# Patient Record
Sex: Female | Born: 1975
Health system: Southern US, Community
[De-identification: ages and names within clinical notes are randomized; demographics above are authoritative.]

## PROBLEM LIST (undated history)

## (undated) DIAGNOSIS — J45909 Unspecified asthma, uncomplicated: Secondary | ICD-10-CM

## (undated) DIAGNOSIS — T7840XA Allergy, unspecified, initial encounter: Secondary | ICD-10-CM

## (undated) HISTORY — DX: Unspecified asthma, uncomplicated: J45.909

## (undated) HISTORY — DX: Allergy, unspecified, initial encounter: T78.40XA

---

## 1997-12-10 ENCOUNTER — Other Ambulatory Visit: Admission: RE | Admit: 1997-12-10 | Discharge: 1997-12-10 | Payer: Self-pay | Admitting: Family Medicine

## 2000-07-25 ENCOUNTER — Other Ambulatory Visit: Admission: RE | Admit: 2000-07-25 | Discharge: 2000-07-25 | Payer: Self-pay | Admitting: Family Medicine

## 2000-08-14 ENCOUNTER — Ambulatory Visit (HOSPITAL_COMMUNITY): Admission: RE | Admit: 2000-08-14 | Discharge: 2000-08-14 | Payer: Self-pay | Admitting: Family Medicine

## 2000-08-14 ENCOUNTER — Encounter: Payer: Self-pay | Admitting: Family Medicine

## 2000-09-08 ENCOUNTER — Ambulatory Visit (HOSPITAL_COMMUNITY): Admission: RE | Admit: 2000-09-08 | Discharge: 2000-09-08 | Payer: Self-pay | Admitting: Family Medicine

## 2000-09-08 ENCOUNTER — Encounter: Payer: Self-pay | Admitting: Family Medicine

## 2000-11-26 ENCOUNTER — Encounter: Payer: Self-pay | Admitting: Family Medicine

## 2000-11-26 ENCOUNTER — Inpatient Hospital Stay (HOSPITAL_COMMUNITY): Admission: AD | Admit: 2000-11-26 | Discharge: 2000-11-29 | Payer: Self-pay | Admitting: Family Medicine

## 2001-01-27 ENCOUNTER — Inpatient Hospital Stay (HOSPITAL_COMMUNITY): Admission: AD | Admit: 2001-01-27 | Discharge: 2001-01-27 | Payer: Self-pay | Admitting: Family Medicine

## 2001-01-29 ENCOUNTER — Encounter: Payer: Self-pay | Admitting: Family Medicine

## 2001-01-29 ENCOUNTER — Inpatient Hospital Stay (HOSPITAL_COMMUNITY): Admission: AD | Admit: 2001-01-29 | Discharge: 2001-01-29 | Payer: Self-pay | Admitting: Family Medicine

## 2001-01-31 ENCOUNTER — Inpatient Hospital Stay (HOSPITAL_COMMUNITY): Admission: AD | Admit: 2001-01-31 | Discharge: 2001-02-03 | Payer: Self-pay | Admitting: Family Medicine

## 2002-07-02 ENCOUNTER — Other Ambulatory Visit: Admission: RE | Admit: 2002-07-02 | Discharge: 2002-07-02 | Payer: Self-pay | Admitting: Family Medicine

## 2002-07-05 ENCOUNTER — Ambulatory Visit (HOSPITAL_COMMUNITY): Admission: RE | Admit: 2002-07-05 | Discharge: 2002-07-05 | Payer: Self-pay | Admitting: Family Medicine

## 2002-07-05 ENCOUNTER — Encounter: Payer: Self-pay | Admitting: Family Medicine

## 2002-08-14 ENCOUNTER — Ambulatory Visit (HOSPITAL_COMMUNITY): Admission: RE | Admit: 2002-08-14 | Discharge: 2002-08-14 | Payer: Self-pay | Admitting: Family Medicine

## 2002-08-14 ENCOUNTER — Encounter: Payer: Self-pay | Admitting: Family Medicine

## 2002-09-04 ENCOUNTER — Ambulatory Visit (HOSPITAL_COMMUNITY): Admission: RE | Admit: 2002-09-04 | Discharge: 2002-09-04 | Payer: Self-pay | Admitting: Family Medicine

## 2002-09-04 ENCOUNTER — Encounter: Payer: Self-pay | Admitting: Family Medicine

## 2002-12-26 ENCOUNTER — Inpatient Hospital Stay (HOSPITAL_COMMUNITY): Admission: AD | Admit: 2002-12-26 | Discharge: 2002-12-27 | Payer: Self-pay | Admitting: Family Medicine

## 2002-12-29 ENCOUNTER — Inpatient Hospital Stay (HOSPITAL_COMMUNITY): Admission: AD | Admit: 2002-12-29 | Discharge: 2002-12-29 | Payer: Self-pay | Admitting: Family Medicine

## 2003-01-03 ENCOUNTER — Inpatient Hospital Stay (HOSPITAL_COMMUNITY): Admission: AD | Admit: 2003-01-03 | Discharge: 2003-01-04 | Payer: Self-pay | Admitting: Family Medicine

## 2012-06-02 ENCOUNTER — Other Ambulatory Visit: Payer: Self-pay | Admitting: Nurse Practitioner

## 2012-06-02 MED ORDER — IBUPROFEN 200 MG PO TABS: 200.0000 mg | ORAL_TABLET | Freq: Four times a day (QID) | ORAL | Status: DC | PRN

## 2012-07-05 ENCOUNTER — Other Ambulatory Visit: Payer: Self-pay | Admitting: Nurse Practitioner

## 2012-07-16 ENCOUNTER — Encounter: Payer: Self-pay | Admitting: General Practice

## 2012-07-16 ENCOUNTER — Ambulatory Visit (INDEPENDENT_AMBULATORY_CARE_PROVIDER_SITE_OTHER): Payer: BC Managed Care – PPO | Admitting: General Practice

## 2012-07-16 VITALS — BP 113/79 | HR 75 | Temp 97.3°F | Ht 64.5 in | Wt 171.0 lb

## 2012-07-16 DIAGNOSIS — N390 Urinary tract infection, site not specified: Secondary | ICD-10-CM

## 2012-07-16 DIAGNOSIS — L709 Acne, unspecified: Secondary | ICD-10-CM

## 2012-07-16 DIAGNOSIS — N912 Amenorrhea, unspecified: Secondary | ICD-10-CM

## 2012-07-16 DIAGNOSIS — L708 Other acne: Secondary | ICD-10-CM

## 2012-07-16 LAB — POCT UA - MICROSCOPIC ONLY
Casts, Ur, LPF, POC: NEGATIVE
Crystals, Ur, HPF, POC: NEGATIVE
Mucus, UA: NEGATIVE
Yeast, UA: NEGATIVE

## 2012-07-16 LAB — POCT URINALYSIS DIPSTICK
Glucose, UA: NEGATIVE
Nitrite, UA: POSITIVE
Spec Grav, UA: 1.02
Urobilinogen, UA: NEGATIVE
pH, UA: 6

## 2012-07-16 LAB — HCG, QUANTITATIVE, PREGNANCY: hCG, Beta Chain, Quant, S: <2 m[IU]/mL

## 2012-07-16 MED ORDER — NITROFURANTOIN MONOHYD MACRO 100 MG PO CAPS: 100.0000 mg | ORAL_CAPSULE | Freq: Two times a day (BID) | ORAL | Status: DC

## 2012-07-16 NOTE — Patient Instructions (Addendum)
Urinary Tract Infection  Urinary tract infections (UTIs) can develop anywhere along your urinary tract. Your urinary tract is your body's drainage system for removing wastes and extra water. Your urinary tract includes two kidneys, two ureters, a bladder, and a urethra. Your kidneys are a pair of bean-shaped organs. Each kidney is about the size of your fist. They are located below your ribs, one on each side of your spine.  CAUSES  Infections are caused by microbes, which are microscopic organisms, including fungi, viruses, and bacteria. These organisms are so small that they can only be seen through a microscope. Bacteria are the microbes that most commonly cause UTIs.  SYMPTOMS   Symptoms of UTIs may vary by age and gender of the patient and by the location of the infection. Symptoms in young women typically include a frequent and intense urge to urinate and a painful, burning feeling in the bladder or urethra during urination. Older women and men are more likely to be tired, shaky, and weak and have muscle aches and abdominal pain. A fever may mean the infection is in your kidneys. Other symptoms of a kidney infection include pain in your back or sides below the ribs, nausea, and vomiting.  DIAGNOSIS  To diagnose a UTI, your caregiver will ask you about your symptoms. Your caregiver also will ask to provide a urine sample. The urine sample will be tested for bacteria and white blood cells. White blood cells are made by your body to help fight infection.  TREATMENT   Typically, UTIs can be treated with medication. Because most UTIs are caused by a bacterial infection, they usually can be treated with the use of antibiotics. The choice of antibiotic and length of treatment depend on your symptoms and the type of bacteria causing your infection.  HOME CARE INSTRUCTIONS   If you were prescribed antibiotics, take them exactly as your caregiver instructs you. Finish the medication even if you feel better after you  have only taken some of the medication.   Drink enough water and fluids to keep your urine clear or pale yellow.   Avoid caffeine, tea, and carbonated beverages. They tend to irritate your bladder.   Empty your bladder often. Avoid holding urine for long periods of time.   Empty your bladder before and after sexual intercourse.   After a bowel movement, women should cleanse from front to back. Use each tissue only once.  SEEK MEDICAL CARE IF:    You have back pain.   You develop a fever.   Your symptoms do not begin to resolve within 3 days.  SEEK IMMEDIATE MEDICAL CARE IF:    You have severe back pain or lower abdominal pain.   You develop chills.   You have nausea or vomiting.   You have continued burning or discomfort with urination.  MAKE SURE YOU:    Understand these instructions.   Will watch your condition.   Will get help right away if you are not doing well or get worse.  Document Released: 11/03/2004 Document Revised: 07/26/2011 Document Reviewed: 03/04/2011  [employer] Patient Information 2014 [employer]

## 2012-07-16 NOTE — Progress Notes (Addendum)
Subjective:    Patient ID: Dawn Caldwell, female    DOB: 11-23-1975, 37 y.o.   MRN: [id number]  HPI Presents today with complaints of no menstrual cycle since last week of April. Reports menstrual cycles are usually very light and cycle monthly. Reports administering several pregnancy test at home and received negative results. Reports she was taking birth control pills, but had missed a couple doses since last cycle. Reports she hasn't taking any birth control pills recently due to possible pregnancy. Patient verbalized that she has acne that is not responding to multiple attempted treatments and request to see dermatologist.     Review of Systems  Constitutional: Negative for fever and chills.       Reports seven pound weight gain  Respiratory: Negative for chest tightness and shortness of breath.   Cardiovascular: Negative for chest pain and palpitations.  Gastrointestinal: Positive for nausea. Negative for vomiting.  Genitourinary: Negative for frequency and difficulty urinating.  All other systems reviewed and are negative.         Objective:   Physical Exam  Constitutional: She is oriented to person, place, and time. She appears well-developed and well-nourished.  Cardiovascular: Normal rate, regular rhythm and normal heart sounds.   Pulmonary/Chest: Effort normal and breath sounds normal.  Abdominal: Soft. Bowel sounds are normal. She exhibits no distension. There is no tenderness.  Neurological: She is alert and oriented to person, place, and time.  Skin: Skin is warm and dry.  Psychiatric: She has a normal mood and affect.     Results for orders placed in visit on 07/16/12  HCG, QUANTITATIVE, PREGNANCY      Result Value Range   hCG, Beta Chain, Quant, S <2.0    POCT URINE PREGNANCY      Result Value Range   Preg Test, Ur Negative    POCT UA - MICROSCOPIC ONLY      Result Value Range   WBC, Ur, HPF, POC 8-10     RBC, urine, microscopic 1-3     Bacteria, U  Microscopic many     Mucus, UA negative     Epithelial cells, urine per micros few     Crystals, Ur, HPF, POC negative     Casts, Ur, LPF, POC negative     Yeast, UA negative    POCT URINALYSIS DIPSTICK      Result Value Range   Color, UA gold     Clarity, UA cloudy     Glucose, UA neg     Bilirubin, UA neg     Ketones, UA neg     Spec Grav, UA 1.020     Blood, UA mod     pH, UA 6.0     Protein, UA 2+     Urobilinogen, UA negative     Nitrite, UA pos     Leukocytes, UA small (1+)            Assessment & Plan:  1. Amenorrhea - POCT urine pregnancy - hCG, quantitative, pregnancy - POCT UA - Microscopic Only - POCT urinalysis dipstick - Urine culture -RTO if symptoms worsen or no menstrual cycle in one week  2. UTI (urinary tract infection) - nitrofurantoin, macrocrystal-monohydrate, (MACROBID) 100 MG capsule; Take 1 capsule (100 mg total) by mouth 2 (two) times daily.  Dispense: 20 capsule; Refill: 0 - POCT UA - Microscopic Only - POCT urinalysis dipstick - Urine culture -Increase fluid intake Frequent voiding Proper perineal hygiene RTO prn Culture pending  3. Acne - Ambulatory referral to Dermatology -keep affected area clean and dry -Patient verbalized understanding -Coralie Keens, FNP-C

## 2012-07-18 LAB — URINE CULTURE: Colony Count: >=100000

## 2012-07-19 ENCOUNTER — Other Ambulatory Visit: Payer: Self-pay | Admitting: General Practice

## 2012-07-19 DIAGNOSIS — Z87440 Personal history of urinary (tract) infections: Secondary | ICD-10-CM

## 2012-07-19 DIAGNOSIS — N390 Urinary tract infection, site not specified: Secondary | ICD-10-CM

## 2012-07-19 MED ORDER — CIPROFLOXACIN HCL 500 MG PO TABS: 500.0000 mg | ORAL_TABLET | Freq: Two times a day (BID) | ORAL | Status: DC

## 2012-07-19 NOTE — Progress Notes (Signed)
Pt aware and will call to make appt.

## 2012-07-20 ENCOUNTER — Telehealth: Payer: Self-pay | Admitting: General Practice

## 2012-07-20 NOTE — Telephone Encounter (Signed)
This has been addressed.

## 2012-09-01 ENCOUNTER — Ambulatory Visit (INDEPENDENT_AMBULATORY_CARE_PROVIDER_SITE_OTHER): Payer: BC Managed Care – PPO | Admitting: General Practice

## 2012-09-01 VITALS — BP 109/74 | HR 73 | Temp 97.9°F | Ht 64.5 in | Wt 177.0 lb

## 2012-09-01 DIAGNOSIS — H6691 Otitis media, unspecified, right ear: Secondary | ICD-10-CM

## 2012-09-01 DIAGNOSIS — J029 Acute pharyngitis, unspecified: Secondary | ICD-10-CM

## 2012-09-01 DIAGNOSIS — H6690 Otitis media, unspecified, unspecified ear: Secondary | ICD-10-CM

## 2012-09-01 DIAGNOSIS — N390 Urinary tract infection, site not specified: Secondary | ICD-10-CM

## 2012-09-01 LAB — POCT URINALYSIS DIPSTICK
Ketones, UA: NEGATIVE
Spec Grav, UA: 1.02
pH, UA: 6.5

## 2012-09-01 LAB — POCT RAPID STREP A (OFFICE): Rapid Strep A Screen: NEGATIVE

## 2012-09-01 MED ORDER — CIPROFLOXACIN HCL 500 MG PO TABS: 500.0000 mg | ORAL_TABLET | Freq: Two times a day (BID) | ORAL | Status: DC

## 2012-09-01 MED ORDER — AMOXICILLIN 500 MG PO CAPS: 500.0000 mg | ORAL_CAPSULE | Freq: Two times a day (BID) | ORAL | Status: DC

## 2012-09-01 NOTE — Patient Instructions (Addendum)

## 2012-09-01 NOTE — Progress Notes (Signed)
  Subjective:    Patient ID: Dawn Caldwell, female    DOB: [birth year], 37 y.o.   MRN: [id number]  HPIPatient presents today with complaints of sore throat and uti symptoms. She reports onset of both as yesterday. Reports having frequent urination, burning and urgency. She reports sore throat is worse today. She denies OTC medications. Last menstrual cycle began August 21, 2012.     Review of Systems  Constitutional: Negative for fever and chills.  HENT: Positive for ear pain, sore throat, rhinorrhea and sneezing. Negative for neck pain, neck stiffness, postnasal drip and sinus pressure.   Respiratory: Negative for chest tightness and shortness of breath.   Cardiovascular: Negative for chest pain and palpitations.  Genitourinary: Positive for dysuria, urgency and frequency. Negative for difficulty urinating.  Neurological: Negative for dizziness, weakness and headaches.       Objective:   Physical Exam  Constitutional: She is oriented to person, place, and time. She appears well-developed and well-nourished.  HENT:  Head: Normocephalic and atraumatic.  Right Ear: External ear normal.  Left Ear: Tympanic membrane is erythematous.  Nose: Nose normal.  Mouth/Throat: Oropharynx is clear and moist.  Neck: Normal range of motion. Neck supple. No thyromegaly present.  Cardiovascular: Normal rate, regular rhythm and normal heart sounds.   Pulmonary/Chest: Effort normal and breath sounds normal.  Lymphadenopathy:    She has no cervical adenopathy.  Neurological: She is alert and oriented to person, place, and time.  Skin: Skin is warm and dry.  Psychiatric: She has a normal mood and affect.   Results for orders placed in visit on 09/01/12  POCT URINALYSIS DIPSTICK      Result Value Range   Color, UA yellow     Clarity, UA cloudy     Glucose, UA neg     Bilirubin, UA neg     Ketones, UA neg     Spec Grav, UA 1.020     Blood, UA moderate     pH, UA 6.5     Protein, UA trace     Urobilinogen, UA negative     Nitrite, UA neg     Leukocytes, UA small (1+)    POCT RAPID STREP A (OFFICE)      Result Value Range   Rapid Strep A Screen Negative  Negative          Assessment & Plan:  1. UTI (urinary tract infection) - POCT urinalysis dipstick - Urine culture - ciprofloxacin (CIPRO) 500 MG tablet; Take 1 tablet (500 mg total) by mouth 2 (two) times daily.  Dispense: 20 tablet; Refill: 0  2. Sore throat - POCT rapid strep A  3. Otitis media, left - amoxicillin (AMOXIL) 500 MG capsule; Take 1 capsule (500 mg total) by mouth 2 (two) times daily.  Dispense: 20 capsule; Refill: 0 -Increase fluid intake AZO over the counter X2 days Frequent voiding Proper perineal hygiene RTO if symptoms worsen or unresolved Patient verbalized understanding Coralie Keens, FNP-C

## 2012-09-04 LAB — URINE CULTURE

## 2012-12-17 ENCOUNTER — Encounter: Payer: Self-pay | Admitting: Family Medicine

## 2012-12-17 ENCOUNTER — Ambulatory Visit (INDEPENDENT_AMBULATORY_CARE_PROVIDER_SITE_OTHER): Payer: BC Managed Care – PPO | Admitting: Family Medicine

## 2012-12-17 VITALS — BP 117/72 | HR 60 | Temp 98.4°F | Ht 65.5 in | Wt 177.8 lb

## 2012-12-17 DIAGNOSIS — J329 Chronic sinusitis, unspecified: Secondary | ICD-10-CM

## 2012-12-17 MED ORDER — METHYLPREDNISOLONE (PAK) 4 MG PO TABS: ORAL_TABLET | ORAL | Status: DC

## 2012-12-17 MED ORDER — AMOXICILLIN 875 MG PO TABS: 875.0000 mg | ORAL_TABLET | Freq: Two times a day (BID) | ORAL | Status: DC

## 2012-12-17 NOTE — Progress Notes (Signed)
  Subjective:    Patient ID: Dawn Caldwell, female    DOB: [birth year], 37 y.o.   MRN: [id number]  HPI  This 37 y.o. female presents for evaluation of sinus pressure and mucopurulent Sinus drainage.  Review of Systems C/o sinus pressure and URI sx's. No chest pain, SOB, HA, dizziness, vision change, N/V, diarrhea, constipation, dysuria, urinary urgency or frequency, myalgias, arthralgias or rash.     Objective:   Physical Exam Vital signs noted  Well developed well nourished female.  HEENT - Head atraumatic Normocephalic                Eyes - PERRLA, Conjuctiva - clear Sclera- Clear EOMI                Ears - EAC's Wnl TM's Wnl Gross Hearing WNL                Nose - Nares patent                 Throat - oropharanx wnl                Face - TTP maxillary sinuses Respiratory - Lungs CTA bilateral Cardiac - RRR S1 and S2 without murmur        Assessment & Plan:  Sinusitis - Plan: amoxicillin (AMOXIL) 875 MG tablet, methylPREDNIsolone (MEDROL DOSPACK) 4 MG tablet Push po fluids, rest, tylenol and motrin otc prn  Deatra Canter FNP

## 2012-12-17 NOTE — Patient Instructions (Signed)

## 2013-04-23 ENCOUNTER — Encounter: Payer: Self-pay | Admitting: Nurse Practitioner

## 2013-04-23 ENCOUNTER — Ambulatory Visit (INDEPENDENT_AMBULATORY_CARE_PROVIDER_SITE_OTHER): Payer: BC Managed Care – PPO | Admitting: Nurse Practitioner

## 2013-04-23 VITALS — BP 120/74 | HR 87 | Temp 98.3°F | Ht 65.0 in | Wt 179.0 lb

## 2013-04-23 DIAGNOSIS — Z01419 Encounter for gynecological examination (general) (routine) without abnormal findings: Secondary | ICD-10-CM

## 2013-04-23 DIAGNOSIS — Z124 Encounter for screening for malignant neoplasm of cervix: Secondary | ICD-10-CM

## 2013-04-23 DIAGNOSIS — Z0000 Encounter for general adult medical examination without abnormal findings: Secondary | ICD-10-CM

## 2013-04-23 LAB — POCT UA - MICROSCOPIC ONLY
Bacteria, U Microscopic: NEGATIVE
CASTS, UR, LPF, POC: NEGATIVE
CRYSTALS, UR, HPF, POC: NEGATIVE
Mucus, UA: NEGATIVE
RBC, urine, microscopic: 1.5

## 2013-04-23 LAB — POCT URINALYSIS DIPSTICK
Bilirubin, UA: NEGATIVE
Glucose, UA: NEGATIVE
KETONES UA: NEGATIVE
NITRITE UA: NEGATIVE
PH UA: 6.5
PROTEIN UA: NEGATIVE
Spec Grav, UA: 1.02
UROBILINOGEN UA: NEGATIVE

## 2013-04-23 LAB — POCT CBC
Granulocyte percent: 74.4 %G (ref 37–80)
HEMATOCRIT: 41.3 % (ref 37.7–47.9)
HEMOGLOBIN: 12.8 g/dL (ref 12.2–16.2)
Lymph, poc: 2.6 (ref 0.6–3.4)
MCH, POC: 26.8 pg — AB (ref 27–31.2)
MCHC: 31 g/dL — AB (ref 31.8–35.4)
MCV: 86.3 fL (ref 80–97)
MPV: 10.5 fL (ref 0–99.8)
POC GRANULOCYTE: 7.9 — AB (ref 2–6.9)
POC LYMPH PERCENT: 24.1 %L (ref 10–50)
Platelet Count, POC: 164 10*3/uL (ref 142–424)
RBC: 4.8 M/uL (ref 4.04–5.48)
RDW, POC: 12.4 %
WBC: 10.6 10*3/uL — AB (ref 4.6–10.2)

## 2013-04-23 MED ORDER — LEVONORGESTREL-ETHINYL ESTRAD 0.1-20 MG-MCG PO TABS: ORAL_TABLET | ORAL | Status: DC

## 2013-04-23 NOTE — Patient Instructions (Signed)

## 2013-04-23 NOTE — Progress Notes (Signed)
Subjective:    Patient ID: Enos Fling, female    DOB: 08-08-1975, 38 y.o.   MRN: [id number]  HPI Patient presents today for annual exam and PAP. Has not had "regular" periods in about 4 months. Has been having bleeding every 28 days but is lighter than usual. Currently taking Orsythia daily. Denies any breakthrough bleeding, additional life stressors, intercourse changes, or discharge. Has taken several pregnancy tests and all were negative. Associated symptoms include increased acne.    Recent eye exam in Feb and dental exam scheduled for April. Has not been watching diet or exercising.    Review of Systems  Constitutional: Negative for fatigue.  Respiratory: Negative for shortness of breath.   Cardiovascular: Negative for chest pain.  Gastrointestinal: Negative for diarrhea and constipation.  Neurological: Negative for headaches.  Psychiatric/Behavioral: Negative for sleep disturbance.       Objective:   Physical Exam  Constitutional: She is oriented to person, place, and time. She appears well-developed and well-nourished.  HENT:  Head: Normocephalic.  Right Ear: Hearing, tympanic membrane, external ear and ear canal normal.  Left Ear: Hearing, tympanic membrane, external ear and ear canal normal.  Nose: Nose normal.  Mouth/Throat: Uvula is midline and oropharynx is clear and moist.  Eyes: Conjunctivae and EOM are normal. Pupils are equal, round, and reactive to light.  Neck: Normal range of motion and full passive range of motion without pain. Neck supple. No JVD present. Carotid bruit is not present. No mass and no thyromegaly present.  Cardiovascular: Normal rate, normal heart sounds and intact distal pulses.   No murmur heard. Pulmonary/Chest: Effort normal and breath sounds normal. Right breast exhibits no inverted nipple, no mass, no nipple discharge, no skin change and no tenderness. Left breast exhibits no inverted nipple, no mass, no nipple discharge, no skin  change and no tenderness.  Abdominal: Soft. Bowel sounds are normal. She exhibits no mass. There is no tenderness.  Genitourinary: Vagina normal and uterus normal. No breast swelling, tenderness, discharge or bleeding.  bimanual exam-No adnexal masses or tenderness. Cervix parous and pink- no discharge Large cystocele  Musculoskeletal: Normal range of motion.  Lymphadenopathy:    She has no cervical adenopathy.  Neurological: She is alert and oriented to person, place, and time.  Skin: Skin is warm and dry.  Psychiatric: She has a normal mood and affect. Her behavior is normal. Judgment and thought content normal.     BP 120/74  Pulse 87  Temp(Src) 98.3 F (36.8 C) (Oral)  Ht '5\' 5"'  (1.651 m)  Wt 179 lb (81.194 kg)  BMI 29.79 kg/m2 Results for orders placed in visit on 04/23/13  POCT UA - MICROSCOPIC ONLY      Result Value Ref Range   WBC, Ur, HPF, POC 5-10     RBC, urine, microscopic 1.5     Bacteria, U Microscopic negative     Mucus, UA negative     Epithelial cells, urine per micros occ     Crystals, Ur, HPF, POC negative     Casts, Ur, LPF, POC negative     Yeast, UA rare    POCT URINALYSIS DIPSTICK      Result Value Ref Range   Color, UA yellow     Clarity, UA clear     Glucose, UA negative     Bilirubin, UA negative     Ketones, UA negative     Spec Grav, UA 1.020     Blood, UA trace  pH, UA 6.5     Protein, UA negative     Urobilinogen, UA negative     Nitrite, UA negative     Leukocytes, UA small (1+)          Assessment & Plan:   1. Annual physical exam   2. Encounter for routine gynecological examination    Orders Placed This Encounter  Procedures  . CMP14+EGFR  . NMR, lipoprofile  . Thyroid Panel With TSH  . Vit D  25 hydroxy (rtn osteoporosis monitoring)  . POCT UA - Microscopic Only  . POCT urinalysis dipstick  . POCT CBC   Meds ordered this encounter  Medications  . levonorgestrel-ethinyl estradiol (ORSYTHIA) 0.1-20 MG-MCG tablet     Sig: TAKE AS DIRECTED    Dispense:  28 tablet    Refill:  11    Order Specific Question:  Supervising Provider    Answer:  Chipper Herb [[id number]]    Labs pending Health maintenance reviewed Diet and exercise encouraged Continue all meds Follow up  In 1 month   [place], FNP

## 2013-04-25 LAB — PAP IG W/ RFLX HPV ASCU: PAP Smear Comment: 0

## 2013-04-25 LAB — NMR, LIPOPROFILE
Cholesterol: 153 mg/dL (ref <200)
HDL CHOLESTEROL BY NMR: 50 mg/dL (ref >=40)
HDL PARTICLE NUMBER: 31.5 umol/L (ref >=30.5)
LDL Particle Number: 1108 nmol/L — ABNORMAL HIGH (ref <1000)
LDL Size: 21.1 nm (ref >20.5)
LDLC SERPL CALC-MCNC: 71 mg/dL (ref <100)
LP-IR Score: <25 (ref <=45)
Small LDL Particle Number: 452 nmol/L (ref <=527)
Triglycerides by NMR: 161 mg/dL — ABNORMAL HIGH (ref <150)

## 2013-04-25 LAB — CMP14+EGFR
ALT: 32 IU/L (ref 0–32)
AST: 28 IU/L (ref 0–40)
Albumin/Globulin Ratio: 1.3 (ref 1.1–2.5)
Albumin: 4 g/dL (ref 3.5–5.5)
Alkaline Phosphatase: 73 IU/L (ref 39–117)
BUN / CREAT RATIO: 17 (ref 8–20)
BUN: 12 mg/dL (ref 6–20)
CO2: 23 mmol/L (ref 18–29)
CREATININE: 0.69 mg/dL (ref 0.57–1.00)
Calcium: 9.1 mg/dL (ref 8.7–10.2)
Chloride: 100 mmol/L (ref 97–108)
GFR calc Af Amer: 130 mL/min/{1.73_m2} (ref >59)
GFR calc non Af Amer: 112 mL/min/{1.73_m2} (ref >59)
GLOBULIN, TOTAL: 3.2 g/dL (ref 1.5–4.5)
Glucose: 89 mg/dL (ref 65–99)
Potassium: 4.4 mmol/L (ref 3.5–5.2)
Sodium: 138 mmol/L (ref 134–144)
TOTAL PROTEIN: 7.2 g/dL (ref 6.0–8.5)
Total Bilirubin: 0.3 mg/dL (ref 0.0–1.2)

## 2013-04-25 LAB — THYROID PANEL WITH TSH
FREE THYROXINE INDEX: 1.8 (ref 1.2–4.9)
T3 Uptake Ratio: 24 % (ref 24–39)
T4, Total: 7.3 ug/dL (ref 4.5–12.0)
TSH: 1.18 u[IU]/mL (ref 0.450–4.500)

## 2013-04-25 LAB — VITAMIN D 25 HYDROXY (VIT D DEFICIENCY, FRACTURES): VIT D 25 HYDROXY: 17.7 ng/mL — AB (ref 30.0–100.0)

## 2013-05-01 ENCOUNTER — Telehealth: Payer: Self-pay | Admitting: Nurse Practitioner

## 2013-05-01 NOTE — Telephone Encounter (Signed)
Letter faxed.

## 2014-02-19 ENCOUNTER — Encounter: Payer: Self-pay | Admitting: Family

## 2014-02-19 ENCOUNTER — Ambulatory Visit (INDEPENDENT_AMBULATORY_CARE_PROVIDER_SITE_OTHER): Payer: BLUE CROSS/BLUE SHIELD | Admitting: Family

## 2014-02-19 VITALS — BP 115/71 | HR 72 | Temp 98.4°F | Ht 65.0 in | Wt 171.4 lb

## 2014-02-19 DIAGNOSIS — M7701 Medial epicondylitis, right elbow: Secondary | ICD-10-CM

## 2014-02-19 MED ORDER — METHYLPREDNISOLONE ACETATE 40 MG/ML IJ SUSP
40.0000 mg | Freq: Once | INTRAMUSCULAR | Status: AC
  Administered 2014-02-19: 40 mg via INTRAMUSCULAR

## 2014-02-19 MED ORDER — MELOXICAM 15 MG PO TABS: 15.0000 mg | ORAL_TABLET | Freq: Every day | ORAL | Status: DC

## 2014-02-19 NOTE — Patient Instructions (Signed)
Medial Epicondylitis (Golfer's Elbow) with Rehab Medial epicondylitis involves inflammation and pain around the inner (medial) portion of the elbow. This pain is caused by inflammation of the tendons in the forearm that flex (bring down) the wrist. Medial epicondylitis is also called golfer's elbow, because it is common among golfers. However, it may occur in any individual who flexes the wrist regularly. If medial epicondylitis is left untreated, it may become a chronic problem. SYMPTOMS   Pain, tenderness, or inflammation over the inner (medial) side of the elbow.  Pain or weakness with gripping activities.  Pain that increases with wrist twisting motions (using a screwdriver, playing golf, bowling). CAUSES  Medial epicondylitis is caused by inflammation of the tendons that flex the wrist. Causes of injury may include:  Chronic, repetitive stress and strain to the tendons that run from the wrist and forearm to the elbow.  Sudden strain on the forearm, including wrist snap when serving balls with racquet sports, or throwing a baseball. RISK INCREASES WITH:  Sports or occupations that require repetitive and/or strenuous forearm and wrist movements (pitching a baseball, golfing, carpentry).  Poor wrist and forearm strength and flexibility.  Failure to warm up properly before activity.  Resuming activity before healing, rehabilitation, and conditioning are complete. PREVENTION   Warm up and stretch properly before activity.  Maintain physical fitness:  Strength, flexibility, and endurance.  Cardiovascular fitness.  Wear and use properly fitted equipment.  Learn and use proper technique and have a coach correct improper technique.  Wear a tennis elbow (counterforce) brace. PROGNOSIS  The course of this condition depends on the degree of the injury. If treated properly, acute cases (symptoms lasting less than 4 weeks) are often resolved in 2 to 6 weeks. Chronic (longer lasting  cases) often resolve in 3 to 6 months, but may require physical therapy. RELATED COMPLICATIONS   Frequently recurring symptoms, resulting in a chronic problem. Properly treating the problem the first time decreases frequency of recurrence.  Chronic inflammation, scarring, and partial tendon tear, requiring surgery.  Delayed healing or resolution of symptoms. TREATMENT  Treatment first involves the use of ice and medicine, to reduce pain and inflammation. Strengthening and stretching exercises may reduce discomfort, if performed regularly. These exercises may be performed at home, if the condition is an acute injury. Chronic cases may require a referral to a physical therapist for evaluation and treatment. Your caregiver may advise a corticosteroid injection to help reduce inflammation. Rarely, surgery is needed. MEDICATION  If pain medicine is needed, nonsteroidal anti-inflammatory medicines (aspirin and ibuprofen), or other minor pain relievers (acetaminophen), are often advised.  Do not take pain medicine for 7 days before surgery.  Prescription pain relievers may be given, if your caregiver thinks they are needed. Use only as directed and only as much as you need.  Corticosteroid injections may be recommended. These injections should be reserved only for the most severe cases, because they can only be given a certain number of times. HEAT AND COLD  Cold treatment (icing) should be applied for 10 to 15 minutes every 2 to 3 hours for inflammation and pain, and immediately after activity that aggravates your symptoms. Use ice packs or an ice massage.  Heat treatment may be used before performing stretching and strengthening activities prescribed by your caregiver, physical therapist, or athletic trainer. Use a heat pack or a warm water soak. SEEK MEDICAL CARE IF: Symptoms get worse or do not improve in 2 weeks, despite treatment. EXERCISES  RANGE OF MOTION (  ROM) AND STRETCHING EXERCISES -  Epicondylitis, Medial (Golfer's Elbow) These exercises may help you when beginning to rehabilitate your injury. Your symptoms may go away with or without further involvement from your physician, physical therapist or athletic trainer. While completing these exercises, remember:   Restoring tissue flexibility helps normal motion to return to the joints. This allows healthier, less painful movement and activity.  An effective stretch should be held for at least 30 seconds.  A stretch should never be painful. You should only feel a gentle lengthening or release in the stretched tissue. RANGE OF MOTION - Wrist Flexion, Active-Assisted  Extend your right / left elbow with your fingers pointing down.*  Gently pull the back of your hand towards you, until you feel a gentle stretch on the top of your forearm.  Hold this position for __________ seconds. Repeat __________ times. Complete this exercise __________ times per day.  *If directed by your physician, physical therapist or athletic trainer, complete this stretch with your elbow bent, rather than extended. RANGE OF MOTION - Wrist Extension, Active-Assisted  Extend your right / left elbow and turn your palm upwards.*  Gently pull your palm and fingertips back, so your wrist extends and your fingers point more toward the ground.  You should feel a gentle stretch on the inside of your forearm.  Hold this position for __________ seconds. Repeat __________ times. Complete this exercise __________ times per day. *If directed by your physician, physical therapist or athletic trainer, complete this stretch with your elbow bent, rather than extended. STRETCH - Wrist Extension   Place your right / left fingertips on a tabletop leaving your elbow slightly bent. Your fingers should point backwards.  Gently press your fingers and palm down onto the table, by straightening your elbow. You should feel a stretch on the inside of your forearm.  Hold  this position for __________ seconds. Repeat __________ times. Complete this stretch __________ times per day.  STRENGTHENING EXERCISES - Epicondylitis, Medial (Golfer's Elbow) These exercises may help you when beginning to rehabilitate your injury. They may resolve your symptoms with or without further involvement from your physician, physical therapist or athletic trainer. While completing these exercises, remember:   Muscles can gain both the endurance and the strength needed for everyday activities through controlled exercises.  Complete these exercises as instructed by your physician, physical therapist or athletic trainer. Increase the resistance and repetitions only as guided.  You may experience muscle soreness or fatigue, but the pain or discomfort you are trying to eliminate should never worsen during these exercises. If this pain does get worse, stop and make sure you are following the directions exactly. If the pain is still present after adjustments, discontinue the exercise until you can discuss the trouble with your caregiver. STRENGTH - Wrist Flexors  Sit with your right / left forearm palm-up, and fully supported on a table or countertop. Your elbow should be resting below the height of your shoulder. Allow your wrist to extend over the edge of the surface.  Loosely holding a __________ weight, or a piece of rubber exercise band or tubing, slowly curl your hand up toward your forearm.  Hold this position for __________ seconds. Slowly lower the wrist back to the starting position in a controlled manner. Repeat __________ times. Complete this exercise __________ times per day.  STRENGTH - Wrist Extensors  Sit with your right / left forearm palm-down and fully supported. Your elbow should be resting below the height of your shoulder.   Allow your wrist to extend over the edge of the surface.  Loosely holding a __________ weight, or a piece of rubber exercise band or tubing, slowly  curl your hand up toward your forearm.  Hold this position for __________ seconds. Slowly lower the wrist back to the starting position in a controlled manner. Repeat __________ times. Complete this exercise __________ times per day.  STRENGTH - Ulnar Deviators  Stand with a ____________________ weight in your right / left hand, or sit while holding a rubber exercise band or tubing, with your healthy arm supported on a table or countertop.  Move your wrist so that your pinkie travels toward your forearm and your thumb moves away from your forearm.  Hold this position for __________ seconds and then slowly lower the wrist back to the starting position. Repeat __________ times. Complete this exercise __________ times per day STRENGTH - Grip   Grasp a tennis ball, a dense sponge, or a large, rolled sock in your hand.  Squeeze as hard as you can, without increasing any pain.  Hold this position for __________ seconds. Release your grip slowly. Repeat __________ times. Complete this exercise __________ times per day.  STRENGTH - Forearm Supinators   Sit with your right / left forearm supported on a table, keeping your elbow below shoulder height. Rest your hand over the edge, palm down.  Gently grip a hammer or a soup ladle.  Without moving your elbow, slowly turn your palm and hand upward to a "thumbs-up" position.  Hold this position for __________ seconds. Slowly return to the starting position. Repeat __________ times. Complete this exercise __________ times per day.  STRENGTH - Forearm Pronators  Sit with your right / left forearm supported on a table, keeping your elbow below shoulder height. Rest your hand over the edge, palm up.  Gently grip a hammer or a soup ladle.  Without moving your elbow, slowly turn your palm and hand upward to a "thumbs-up" position.  Hold this position for __________ seconds. Slowly return to the starting position. Repeat __________ times. Complete  this exercise __________ times per day.  Document Released: 01/24/2005 Document Revised: 04/18/2011 Document Reviewed: 05/08/2008 [employer] Patient Information 2015 [employer]. This information is not intended to replace advice given to you by your health care provider. Make sure you discuss any questions you have with your health care provider.  

## 2014-02-19 NOTE — Progress Notes (Signed)
   Subjective:    Patient ID: Dawn Caldwell, female    DOB: [birth year], 39 y.o.   MRN: [id number]  HPI Pt presents to the office for right elbow pain that started back in the summer. Pt saw nurse at work and was given elbow brace which helped, but pain seems worse now. Pt states Advil no longer helps with the pain. 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 Pt states the pain is an aching, dull 8 out 10. Pt states the pain has waken her up from sleep.    Review of Systems  Constitutional: Negative.   HENT: Negative.   Eyes: Negative.   Respiratory: Negative.  Negative for shortness of breath.   Cardiovascular: Negative.  Negative for palpitations.  Gastrointestinal: Negative.   Endocrine: Negative.   Genitourinary: Negative.   Musculoskeletal: Positive for joint swelling.  Neurological: Negative.  Negative for headaches.  Hematological: Negative.   Psychiatric/Behavioral: Negative.   All other systems reviewed and are negative.      Objective:   Physical Exam  Constitutional: She is oriented to person, place, and time. She appears well-developed and well-nourished. No distress.  HENT:  Head: Normocephalic and atraumatic.  Right Ear: External ear normal.  Mouth/Throat: Oropharynx is clear and moist.  Eyes: Pupils are equal, round, and reactive to light.  Neck: Normal range of motion. Neck supple. No thyromegaly present.  Cardiovascular: Normal rate, regular rhythm, normal heart sounds and intact distal pulses.   No murmur heard. Pulmonary/Chest: Effort normal and breath sounds normal. No respiratory distress. She has no wheezes.  Abdominal: Soft. Bowel sounds are normal. She exhibits no distension. There is no tenderness.  Musculoskeletal: Normal range of motion. She exhibits tenderness. She exhibits no edema.  Tenderness of right elbow with movement and palpation of inner arm//elbow   Neurological: She is alert and oriented to person, place, and time.  She has normal reflexes. No cranial nerve deficit.  Skin: Skin is warm and dry.  Psychiatric: She has a normal mood and affect. Her behavior is normal. Judgment and thought content normal.  Vitals reviewed.   BP 115/71 mmHg  Pulse 72  Temp(Src) 98.4 F (36.9 C) (Oral)  Ht 5\' 5"  (1.651 m)  Wt 171 lb 6.4 oz (77.747 kg)  BMI 28.52 kg/m2  LMP 02/13/2014       Assessment & Plan:  1. Golfer's elbow, right -Rest -Ice -Wear brace -No other NSAID's - methylPREDNISolone acetate (DEPO-MEDROL) injection 40 mg; Inject 1 mL (40 mg total) into the muscle once. - meloxicam (MOBIC) 15 MG tablet; Take 1 tablet (15 mg total) by mouth daily.  Dispense: 30 tablet; Refill: 2  Jannifer Rodneyhristy Kristyn Obyrne, FNP

## 2014-06-20 ENCOUNTER — Ambulatory Visit (INDEPENDENT_AMBULATORY_CARE_PROVIDER_SITE_OTHER): Payer: BLUE CROSS/BLUE SHIELD | Admitting: Nurse Practitioner

## 2014-06-20 ENCOUNTER — Encounter: Payer: Self-pay | Admitting: Nurse Practitioner

## 2014-06-20 VITALS — BP 109/71 | HR 73 | Temp 98.6°F | Ht 65.0 in | Wt 170.0 lb

## 2014-06-20 DIAGNOSIS — Z01419 Encounter for gynecological examination (general) (routine) without abnormal findings: Secondary | ICD-10-CM | POA: Diagnosis not present

## 2014-06-20 DIAGNOSIS — R300 Dysuria: Secondary | ICD-10-CM

## 2014-06-20 DIAGNOSIS — Z0000 Encounter for general adult medical examination without abnormal findings: Secondary | ICD-10-CM

## 2014-06-20 DIAGNOSIS — N3000 Acute cystitis without hematuria: Secondary | ICD-10-CM

## 2014-06-20 LAB — POCT CBC
GRANULOCYTE PERCENT: 62.2 % (ref 37–80)
HCT, POC: 43.5 % (ref 37.7–47.9)
Hemoglobin: 13.6 g/dL (ref 12.2–16.2)
LYMPH, POC: 2.5 (ref 0.6–3.4)
MCH, POC: 26.6 pg — AB (ref 27–31.2)
MCHC: 31.2 g/dL — AB (ref 31.8–35.4)
MCV: 85.1 fL (ref 80–97)
MPV: 9.9 fL (ref 0–99.8)
POC Granulocyte: 5.1 (ref 2–6.9)
POC LYMPH PERCENT: 30.5 %L (ref 10–50)
Platelet Count, POC: 180 10*3/uL (ref 142–424)
RBC: 5.11 M/uL (ref 4.04–5.48)
RDW, POC: 13 %
WBC: 8.2 10*3/uL (ref 4.6–10.2)

## 2014-06-20 LAB — POCT URINALYSIS DIPSTICK
Bilirubin, UA: NEGATIVE
Glucose, UA: NEGATIVE
KETONES UA: NEGATIVE
Nitrite, UA: POSITIVE
SPEC GRAV UA: 1.015
UROBILINOGEN UA: NEGATIVE
pH, UA: 6

## 2014-06-20 LAB — POCT UA - MICROSCOPIC ONLY
CASTS, UR, LPF, POC: NEGATIVE
Crystals, Ur, HPF, POC: NEGATIVE
Mucus, UA: NEGATIVE
Yeast, UA: NEGATIVE

## 2014-06-20 MED ORDER — NITROFURANTOIN MONOHYD MACRO 100 MG PO CAPS: 100.0000 mg | ORAL_CAPSULE | Freq: Two times a day (BID) | ORAL | Status: DC

## 2014-06-20 MED ORDER — LEVONORGESTREL-ETHINYL ESTRAD 0.1-20 MG-MCG PO TABS: ORAL_TABLET | ORAL | Status: DC

## 2014-06-20 NOTE — Patient Instructions (Signed)
Exercise to Stay Healthy Exercise helps you become and stay healthy. EXERCISE IDEAS AND TIPS Choose exercises that:  You enjoy.  Fit into your day. You do not need to exercise really hard to be healthy. You can do exercises at a slow or medium level and stay healthy. You can:  Stretch before and after working out.  Try yoga, Pilates, or tai chi.  Lift weights.  Walk fast, swim, jog, run, climb stairs, bicycle, dance, or rollerskate.  Take aerobic classes. Exercises that burn about 150 calories:  Running 1  miles in 15 minutes.  Playing volleyball for 45 to 60 minutes.  Washing and waxing a car for 45 to 60 minutes.  Playing touch football for 45 minutes.  Walking 1  miles in 35 minutes.  Pushing a stroller 1  miles in 30 minutes.  Playing basketball for 30 minutes.  Raking leaves for 30 minutes.  Bicycling 5 miles in 30 minutes.  Walking 2 miles in 30 minutes.  Dancing for 30 minutes.  Shoveling snow for 15 minutes.  Swimming laps for 20 minutes.  Walking up stairs for 15 minutes.  Bicycling 4 miles in 15 minutes.  Gardening for 30 to 45 minutes.  Jumping rope for 15 minutes.  Washing windows or floors for 45 to 60 minutes. Document Released: 02/26/2010 Document Revised: 04/18/2011 Document Reviewed: 02/26/2010 [employer] Patient Information 2015 [employer]. This information is not intended to replace advice given to you by your health care provider. Make sure you discuss any questions you have with your health care provider.  

## 2014-06-20 NOTE — Progress Notes (Signed)
Subjective:    Patient ID: Dawn Caldwell, female    DOB: [birth year], 39 y.o.   MRN: [id number]  HPI Patient in today for annual physical exam and pap-SHe is doing well without complaints. No curren tmedical problems- only meds she is on is birth control.    Review of Systems  Constitutional: Negative.   HENT: Negative.   Respiratory: Negative.   Cardiovascular: Negative.   Gastrointestinal: Negative.   Genitourinary: Negative.   Neurological: Negative.   Psychiatric/Behavioral: Negative.   All other systems reviewed and are negative.      Objective:   Physical Exam  Constitutional: She is oriented to person, place, and time. She appears well-developed and well-nourished.  HENT:  Head: Normocephalic.  Right Ear: Hearing, tympanic membrane, external ear and ear canal normal.  Left Ear: Hearing, tympanic membrane, external ear and ear canal normal.  Nose: Nose normal.  Mouth/Throat: Uvula is midline and oropharynx is clear and moist.  Eyes: Conjunctivae and EOM are normal. Pupils are equal, round, and reactive to light.  Neck: Trachea normal, normal range of motion and full passive range of motion without pain. Neck supple. No JVD present. Carotid bruit is not present. No thyroid mass and no thyromegaly present.  Cardiovascular: Normal rate, regular rhythm, normal heart sounds and intact distal pulses.  Exam reveals no gallop and no friction rub.   No murmur heard. Pulmonary/Chest: Effort normal and breath sounds normal. Right breast exhibits no inverted nipple, no mass, no nipple discharge, no skin change and no tenderness. Left breast exhibits no inverted nipple, no mass, no nipple discharge, no skin change and no tenderness.  Abdominal: Soft. Bowel sounds are normal. She exhibits no distension and no mass. There is no tenderness.  Genitourinary: Vagina normal and uterus normal. No breast swelling, tenderness, discharge or bleeding.  bimanual exam-No adnexal masses or  tenderness. Cervix parous and pink - no vaginal discharge  Musculoskeletal: Normal range of motion.  Lymphadenopathy:    She has no cervical adenopathy.  Neurological: She is alert and oriented to person, place, and time. She has normal reflexes.  Skin: Skin is warm and dry.  Psychiatric: She has a normal mood and affect. Her behavior is normal. Judgment and thought content normal.   BP 109/71 mmHg  Pulse 73  Temp(Src) 98.6 F (37 C) (Oral)  Ht $R'5\' 5"'ET$  (1.651 m)  Wt 170 lb (77.111 kg)  BMI 28.29 kg/m2  Results for orders placed or performed in visit on 06/20/14  POCT UA - Microscopic Only  Result Value Ref Range   WBC, Ur, HPF, POC 10-20    RBC, urine, microscopic 5-6    Bacteria, U Microscopic many    Mucus, UA neg    Epithelial cells, urine per micros few    Crystals, Ur, HPF, POC neg    Casts, Ur, LPF, POC neg    Yeast, UA neg   POCT urinalysis dipstick  Result Value Ref Range   Color, UA gold    Clarity, UA cloudy    Glucose, UA neg    Bilirubin, UA neg    Ketones, UA neg    Spec Grav, UA 1.015    Blood, UA mod    pH, UA 6.0    Protein, UA 3+    Urobilinogen, UA negative    Nitrite, UA pos    Leukocytes, UA large (3+)            Assessment & Plan:  1. Annual physical exam -  POCT UA - Microscopic Only - POCT urinalysis dipstick - POCT CBC - CMP14+EGFR - NMR, lipoprofile - Thyroid Panel With TSH - Vit D  25 hydroxy (rtn osteoporosis monitoring)  2. Encounter for routine gynecological examination - Pap IG w/ reflex to HPV when ASC-U  3. UTI Take medication as prescribe Cotton underwear Take shower not bath Cranberry juice, yogurt Force fluids AZO over the counter X2 days Culture pending RTO prn  Meds ordered this encounter  Medications  . levonorgestrel-ethinyl estradiol (ORSYTHIA) 0.1-20 MG-MCG tablet    Sig: TAKE AS DIRECTED    Dispense:  28 tablet    Refill:  11    Order Specific Question:  Supervising Provider    Answer:  Chipper Herb [[id number]]  . nitrofurantoin, macrocrystal-monohydrate, (MACROBID) 100 MG capsule    Sig: Take 1 capsule (100 mg total) by mouth 2 (two) times daily. 1 po BId    Dispense:  14 capsule    Refill:  0    Order Specific Question:  Supervising Provider    Answer:  Chipper Herb [[id number]]      Labs pending Health maintenance reviewed Diet and exercise encouraged Continue all meds Follow up  In 1 year   [place], FNP

## 2014-06-20 NOTE — Addendum Note (Signed)
Addended by: Orma RenderHODGES, Jobie Popp F on: 06/20/2014 11:56 AM   Modules accepted: Orders

## 2014-06-21 LAB — CMP14+EGFR
ALBUMIN: 4.2 g/dL (ref 3.5–5.5)
ALT: 28 IU/L (ref 0–32)
AST: 23 IU/L (ref 0–40)
Albumin/Globulin Ratio: 1.2 (ref 1.1–2.5)
Alkaline Phosphatase: 77 IU/L (ref 39–117)
BUN/Creatinine Ratio: 17 (ref 8–20)
BUN: 11 mg/dL (ref 6–20)
Bilirubin Total: 0.4 mg/dL (ref 0.0–1.2)
CHLORIDE: 101 mmol/L (ref 97–108)
CO2: 25 mmol/L (ref 18–29)
CREATININE: 0.64 mg/dL (ref 0.57–1.00)
Calcium: 9.3 mg/dL (ref 8.7–10.2)
GFR calc non Af Amer: 114 mL/min/{1.73_m2} (ref >59)
GFR, EST AFRICAN AMERICAN: 132 mL/min/{1.73_m2} (ref >59)
GLOBULIN, TOTAL: 3.5 g/dL (ref 1.5–4.5)
Glucose: 91 mg/dL (ref 65–99)
Potassium: 4.7 mmol/L (ref 3.5–5.2)
SODIUM: 137 mmol/L (ref 134–144)
TOTAL PROTEIN: 7.7 g/dL (ref 6.0–8.5)

## 2014-06-21 LAB — NMR, LIPOPROFILE
CHOLESTEROL: 143 mg/dL (ref 100–199)
HDL Cholesterol by NMR: 48 mg/dL (ref >39)
HDL Particle Number: 31 umol/L (ref >=30.5)
LDL Particle Number: 981 nmol/L (ref <1000)
LDL SIZE: 21.4 nm (ref >20.5)
LDL-C: 78 mg/dL (ref 0–99)
LP-IR SCORE: 33 (ref <=45)
Small LDL Particle Number: 392 nmol/L (ref <=527)
Triglycerides by NMR: 87 mg/dL (ref 0–149)

## 2014-06-21 LAB — THYROID PANEL WITH TSH
FREE THYROXINE INDEX: 2 (ref 1.2–4.9)
T3 Uptake Ratio: 27 % (ref 24–39)
T4 TOTAL: 7.3 ug/dL (ref 4.5–12.0)
TSH: 2.21 u[IU]/mL (ref 0.450–4.500)

## 2014-06-21 LAB — VITAMIN D 25 HYDROXY (VIT D DEFICIENCY, FRACTURES): Vit D, 25-Hydroxy: 16.1 ng/mL — ABNORMAL LOW (ref 30.0–100.0)

## 2014-06-22 LAB — URINE CULTURE

## 2014-06-24 ENCOUNTER — Telehealth: Payer: Self-pay | Admitting: *Deleted

## 2014-06-24 NOTE — Telephone Encounter (Signed)
-----   Message from [facility] IncMary-Margaret Martin, FNP sent at 06/23/2014  8:43 AM EDT ----- Patient was given macrobid- good coverage Cbc normal Kidney and liver function stable Cholesterol looks great Thyroid panel normal Vitamin d low- vitamin d 2000iu oTC daily Continue current meds- low fat diet and exercise and recheck in 3 months

## 2014-06-24 NOTE — Telephone Encounter (Signed)
Pt notified of results Verbalizes understanding 

## 2014-06-25 LAB — PAP IG W/ RFLX HPV ASCU: PAP Smear Comment: 0

## 2014-06-26 ENCOUNTER — Telehealth: Payer: Self-pay | Admitting: *Deleted

## 2014-06-26 NOTE — Telephone Encounter (Signed)
Pt notified of results Verbalizes understanding 

## 2014-06-26 NOTE — Telephone Encounter (Signed)
-----   Message from Bennie PieriniMary-Margaret Martin, FNP sent at 06/25/2014 12:50 PM EDT ----- Pap normal- repeat in 2 years

## 2014-09-16 ENCOUNTER — Telehealth: Payer: Self-pay | Admitting: Nurse Practitioner

## 2014-09-16 NOTE — Telephone Encounter (Signed)
Appointment given for Thursday @ 12:30 with Paulene Floor, FNP.

## 2014-09-18 ENCOUNTER — Encounter: Payer: Self-pay | Admitting: Nurse Practitioner

## 2014-09-18 ENCOUNTER — Ambulatory Visit (INDEPENDENT_AMBULATORY_CARE_PROVIDER_SITE_OTHER): Payer: BLUE CROSS/BLUE SHIELD | Admitting: Nurse Practitioner

## 2014-09-18 DIAGNOSIS — R102 Pelvic and perineal pain: Secondary | ICD-10-CM

## 2014-09-18 NOTE — Patient Instructions (Signed)
Ovarian Cyst An ovarian cyst is a fluid-filled sac that forms on an ovary. The ovaries are small organs that produce eggs in women. Various types of cysts can form on the ovaries. Most are not cancerous. Many do not cause problems, and they often go away on their own. Some may cause symptoms and require treatment. Common types of ovarian cysts include:  Functional cysts--These cysts may occur every month during the menstrual cycle. This is normal. The cysts usually go away with the next menstrual cycle if the woman does not get pregnant. Usually, there are no symptoms with a functional cyst.  Endometrioma cysts--These cysts form from the tissue that lines the uterus. They are also called "chocolate cysts" because they become filled with blood that turns brown. This type of cyst can cause pain in the lower abdomen during intercourse and with your menstrual period.  Cystadenoma cysts--This type develops from the cells on the outside of the ovary. These cysts can get very big and cause lower abdomen pain and pain with intercourse. This type of cyst can twist on itself, cut off its blood supply, and cause severe pain. It can also easily rupture and cause a lot of pain.  Dermoid cysts--This type of cyst is sometimes found in both ovaries. These cysts may contain different kinds of body tissue, such as skin, teeth, hair, or cartilage. They usually do not cause symptoms unless they get very big.  Theca lutein cysts--These cysts occur when too much of a certain hormone (human chorionic gonadotropin) is produced and overstimulates the ovaries to produce an egg. This is most common after procedures used to assist with the conception of a baby (in vitro fertilization). CAUSES   Fertility drugs can cause a condition in which multiple large cysts are formed on the ovaries. This is called ovarian hyperstimulation syndrome.  A condition called polycystic ovary syndrome can cause hormonal imbalances that can lead to  nonfunctional ovarian cysts. SIGNS AND SYMPTOMS  Many ovarian cysts do not cause symptoms. If symptoms are present, they may include:  Pelvic pain or pressure.  Pain in the lower abdomen.  Pain during sexual intercourse.  Increasing girth (swelling) of the abdomen.  Abnormal menstrual periods.  Increasing pain with menstrual periods.  Stopping having menstrual periods without being pregnant. DIAGNOSIS  These cysts are commonly found during a routine or annual pelvic exam. Tests may be ordered to find out more about the cyst. These tests may include:  Ultrasound.  X-ray of the pelvis.  CT scan.  MRI.  Blood tests. TREATMENT  Many ovarian cysts go away on their own without treatment. Your health care provider may want to check your cyst regularly for 2-3 months to see if it changes. For women in menopause, it is particularly important to monitor a cyst closely because of the higher rate of ovarian cancer in menopausal women. When treatment is needed, it may include any of the following:  A procedure to drain the cyst (aspiration). This may be done using a long needle and ultrasound. It can also be done through a laparoscopic procedure. This involves using a thin, lighted tube with a tiny camera on the end (laparoscope) inserted through a small incision.  Surgery to remove the whole cyst. This may be done using laparoscopic surgery or an open surgery involving a larger incision in the lower abdomen.  Hormone treatment or birth control pills. These methods are sometimes used to help dissolve a cyst. HOME CARE INSTRUCTIONS   Only take over-the-counter   or prescription medicines as directed by your health care provider.  Follow up with your health care provider as directed.  Get regular pelvic exams and Pap tests. SEEK MEDICAL CARE IF:   Your periods are late, irregular, or painful, or they stop.  Your pelvic pain or abdominal pain does not go away.  Your abdomen becomes  larger or swollen.  You have pressure on your bladder or trouble emptying your bladder completely.  You have pain during sexual intercourse.  You have feelings of fullness, pressure, or discomfort in your stomach.  You lose weight for no apparent reason.  You feel generally ill.  You become constipated.  You lose your appetite.  You develop acne.  You have an increase in body and facial hair.  You are gaining weight, without changing your exercise and eating habits.  You think you are pregnant. SEEK IMMEDIATE MEDICAL CARE IF:   You have increasing abdominal pain.  You feel sick to your stomach (nauseous), and you throw up (vomit).  You develop a fever that comes on suddenly.  You have abdominal pain during a bowel movement.  Your menstrual periods become heavier than usual. MAKE SURE YOU:  Understand these instructions.  Will watch your condition.  Will get help right away if you are not doing well or get worse. Document Released: 01/24/2005 Document Revised: 01/29/2013 Document Reviewed: 10/01/2012 [employer] Patient Information 2015 [employer]. This information is not intended to replace advice given to you by your health care provider. Make sure you discuss any questions you have with your health care provider.  

## 2014-09-18 NOTE — Progress Notes (Signed)
   Subjective:    Patient ID: Dawn Caldwell, female    DOB: 10/03/1975, 39 y.o.   MRN: [id number]  HPI Patient went to urgent care on Snday with  Right pelvic pain- they sent her to [facility] to have ct scan- only thing found was Right ovarian cyst- hurts when she walks mainly. No pain with intercourse.Pain is intermittent- rates pain 4/10 currently. SItting and resting relieves pain.    Review of Systems  Constitutional: Negative.   HENT: Negative.   Respiratory: Negative.   Cardiovascular: Negative.   Genitourinary: Negative.   Neurological: Negative.   Psychiatric/Behavioral: Negative.   All other systems reviewed and are negative.      Objective:   Physical Exam  Constitutional: She is oriented to person, place, and time. She appears well-developed and well-nourished.  Cardiovascular: Normal rate, regular rhythm and normal heart sounds.   Pulmonary/Chest: Effort normal and breath sounds normal.  Abdominal: Soft. Tenderness: right pelvic pain.  Neurological: She is alert and oriented to person, place, and time.  Skin: Skin is warm.  Psychiatric: She has a normal mood and affect. Her behavior is normal. Judgment and thought content normal.   BP 112/72 mmHg  Pulse 87  Temp(Src) 97.2 F (36.2 C) (Oral)  Ht  (1.651 m)  Wt 160 lb 12.8 oz (72.938 kg)  BMI 26.76 kg/m2  LMP 09/11/2014        Assessment & Plan:  1. Pelvic pain in female Continue naprosyn as rx Keep diary of pain Orders Placed This Encounter  Procedures  . US Pelvis Complete    Standing Status: Future     Number of Occurrences:      Standing Expiration Date: 11/18/2015    Order Specific Question:  Reason for Exam (SYMPTOM  OR DIAGNOSIS REQUIRED)    Answer:  right ovarian cyst    Order Specific Question:  Preferred imaging location?    Answer:  [facility]   [place], [place]

## 2014-09-19 ENCOUNTER — Other Ambulatory Visit: Payer: Self-pay | Admitting: Nurse Practitioner

## 2014-09-19 DIAGNOSIS — R102 Pelvic and perineal pain: Secondary | ICD-10-CM

## 2014-09-23 ENCOUNTER — Other Ambulatory Visit: Payer: Self-pay | Admitting: Nurse Practitioner

## 2014-09-23 ENCOUNTER — Other Ambulatory Visit (HOSPITAL_COMMUNITY): Payer: Self-pay

## 2014-09-23 ENCOUNTER — Ambulatory Visit (HOSPITAL_COMMUNITY)
Admission: RE | Admit: 2014-09-23 | Discharge: 2014-09-23 | Disposition: A | Discharge status: Home / Self Care | Payer: BLUE CROSS/BLUE SHIELD | Source: Ambulatory Visit | Attending: Nurse Practitioner | Admitting: Nurse Practitioner

## 2014-09-23 DIAGNOSIS — N8320 Unspecified ovarian cysts: Secondary | ICD-10-CM | POA: Diagnosis not present

## 2014-09-23 DIAGNOSIS — N888 Other specified noninflammatory disorders of cervix uteri: Secondary | ICD-10-CM | POA: Diagnosis not present

## 2014-09-23 DIAGNOSIS — R102 Pelvic and perineal pain unspecified side: Secondary | ICD-10-CM

## 2014-10-31 ENCOUNTER — Ambulatory Visit: Payer: BLUE CROSS/BLUE SHIELD | Admitting: Family Medicine

## 2014-11-13 ENCOUNTER — Ambulatory Visit (INDEPENDENT_AMBULATORY_CARE_PROVIDER_SITE_OTHER): Payer: BLUE CROSS/BLUE SHIELD

## 2014-11-13 DIAGNOSIS — Z23 Encounter for immunization: Secondary | ICD-10-CM | POA: Diagnosis not present

## 2015-04-24 ENCOUNTER — Encounter: Payer: BLUE CROSS/BLUE SHIELD | Admitting: Nurse Practitioner

## 2015-05-13 ENCOUNTER — Encounter (INDEPENDENT_AMBULATORY_CARE_PROVIDER_SITE_OTHER): Payer: Self-pay

## 2015-07-03 ENCOUNTER — Encounter: Payer: BLUE CROSS/BLUE SHIELD | Admitting: Nurse Practitioner

## 2015-07-10 ENCOUNTER — Encounter: Payer: Self-pay | Admitting: *Deleted

## 2015-08-19 ENCOUNTER — Encounter: Payer: Self-pay | Admitting: Nurse Practitioner

## 2015-08-19 ENCOUNTER — Ambulatory Visit (INDEPENDENT_AMBULATORY_CARE_PROVIDER_SITE_OTHER): Payer: BLUE CROSS/BLUE SHIELD | Admitting: Nurse Practitioner

## 2015-08-19 VITALS — BP 109/80 | HR 70 | Temp 97.4°F | Ht 65.0 in | Wt 173.0 lb

## 2015-08-19 DIAGNOSIS — Z0000 Encounter for general adult medical examination without abnormal findings: Secondary | ICD-10-CM

## 2015-08-19 DIAGNOSIS — Z6828 Body mass index (BMI) 28.0-28.9, adult: Secondary | ICD-10-CM

## 2015-08-19 DIAGNOSIS — Z01419 Encounter for gynecological examination (general) (routine) without abnormal findings: Secondary | ICD-10-CM | POA: Diagnosis not present

## 2015-08-19 DIAGNOSIS — N946 Dysmenorrhea, unspecified: Secondary | ICD-10-CM | POA: Insufficient documentation

## 2015-08-19 LAB — URINALYSIS, COMPLETE
Bilirubin, UA: NEGATIVE
Glucose, UA: NEGATIVE
Ketones, UA: NEGATIVE
LEUKOCYTES UA: NEGATIVE
Nitrite, UA: POSITIVE — AB
PH UA: 6.5 (ref 5.0–7.5)
Protein, UA: NEGATIVE
RBC, UA: NEGATIVE
SPEC GRAV UA: 1.025 (ref 1.005–1.030)
Urobilinogen, Ur: 0.2 mg/dL (ref 0.2–1.0)

## 2015-08-19 LAB — MICROSCOPIC EXAMINATION

## 2015-08-19 MED ORDER — NAPROXEN 375 MG PO TABS: 375.0000 mg | ORAL_TABLET | Freq: Two times a day (BID) | ORAL | Status: DC

## 2015-08-19 NOTE — Progress Notes (Signed)
   Subjective:    Patient ID: Dawn Caldwell, female    DOB: [birth year], 40 y.o.   MRN: [id number]  HPI  Patient in today for annual physical exam and pap-SHe is doing well without complaints. No current medical problems. Husband had vasectomy so she is no longer on birth control.  * would like refill on naprosyn that she takes when her period starts for cramping.  Review of Systems  Constitutional: Negative.   HENT: Negative.   Respiratory: Negative.   Cardiovascular: Negative.   Gastrointestinal: Negative.   Genitourinary: Negative.   Neurological: Negative.   Psychiatric/Behavioral: Negative.   All other systems reviewed and are negative.      Objective:   Physical Exam  Constitutional: She is oriented to person, place, and time. She appears well-developed and well-nourished.  HENT:  Head: Normocephalic.  Right Ear: Hearing, tympanic membrane, external ear and ear canal normal.  Left Ear: Hearing, tympanic membrane, external ear and ear canal normal.  Nose: Nose normal.  Mouth/Throat: Uvula is midline and oropharynx is clear and moist.  Eyes: Conjunctivae and EOM are normal. Pupils are equal, round, and reactive to light.  Neck: Trachea normal, normal range of motion and full passive range of motion without pain. Neck supple. No JVD present. Carotid bruit is not present. No thyroid mass and no thyromegaly present.  Cardiovascular: Normal rate, regular rhythm, normal heart sounds and intact distal pulses.  Exam reveals no gallop and no friction rub.   No murmur heard. Pulmonary/Chest: Effort normal and breath sounds normal. Right breast exhibits no inverted nipple, no mass, no nipple discharge, no skin change and no tenderness. Left breast exhibits no inverted nipple, no mass, no nipple discharge, no skin change and no tenderness.  Abdominal: Soft. Bowel sounds are normal. She exhibits no distension and no mass. There is no tenderness.  Genitourinary: Vagina normal and uterus  normal. No breast swelling, tenderness, discharge or bleeding.  bimanual exam-No adnexal masses or tenderness. Cervix parous and pink - no vaginal discharge  Musculoskeletal: Normal range of motion.  Lymphadenopathy:    She has no cervical adenopathy.  Neurological: She is alert and oriented to person, place, and time. She has normal reflexes.  Skin: Skin is warm and dry.  Psychiatric: She has a normal mood and affect. Her behavior is normal. Judgment and thought content normal.    BP 109/80 mmHg  Pulse 70  Temp(Src) 97.4 F (36.3 C) (Oral)  Ht _0  (1.651 m)  Wt 173 lb (78.472 kg)  BMI 28.79 kg/m2      Assessment & Plan:  1. Annual physical exam - Urinalysis, Complete - CBC with Differential/Platelet - CMP14+EGFR - Lipid panel - Thyroid Panel With TSH - VITAMIN D 25 Hydroxy (Vit-D Deficiency, Fractures)  2. Encounter for routine gynecological examination - Pap IG w/ reflex to HPV when ASC-U  3. Menstrual cramps - naproxen (NAPROSYN) 375 MG tablet; Take 1 tablet (375 mg total) by mouth 2 (two) times daily.  Dispense: 30 tablet; Refill: 3  4. BMI 28.0-28.9,adult Discussed diet and exercise for person with BMI >25 Will recheck weight in 3-6 months     Labs pending Health maintenance reviewed Diet and exercise encouraged Continue all meds Follow up  In 1 year   [place], FNP

## 2015-08-19 NOTE — Patient Instructions (Signed)
Health Maintenance, Female Adopting a healthy lifestyle and getting preventive care can go a long way to promote health and wellness. Talk with your health care provider about what schedule of regular examinations is right for you. This is a good chance for you to check in with your provider about disease prevention and staying healthy. In between checkups, there are plenty of things you can do on your own. Experts have done a lot of research about which lifestyle changes and preventive measures are most likely to keep you healthy. Ask your health care provider for more information. WEIGHT AND DIET  Eat a healthy diet  Be sure to include plenty of vegetables, fruits, low-fat dairy products, and lean protein.  Do not eat a lot of foods high in solid fats, added sugars, or salt.  Get regular exercise. This is one of the most important things you can do for your health.  Most adults should exercise for at least 150 minutes each week. The exercise should increase your heart rate and make you sweat (moderate-intensity exercise).  Most adults should also do strengthening exercises at least twice a week. This is in addition to the moderate-intensity exercise.  Maintain a healthy weight  Body mass index (BMI) is a measurement that can be used to identify possible weight problems. It estimates body fat based on height and weight. Your health care provider can help determine your BMI and help you achieve or maintain a healthy weight.  For females 20 years of age and older:   A BMI below 18.5 is considered underweight.  A BMI of 18.5 to 24.9 is normal.  A BMI of 25 to 29.9 is considered overweight.  A BMI of 30 and above is considered obese.  Watch levels of cholesterol and blood lipids  You should start having your blood tested for lipids and cholesterol at 38 years of age, then have this test every 5 years.  You may need to have your cholesterol levels checked more often if:  Your lipid  or cholesterol levels are high.  You are older than 38 years of age.  You are at high risk for heart disease.  CANCER SCREENING   Lung Cancer  Lung cancer screening is recommended for adults 55-80 years old who are at high risk for lung cancer because of a history of smoking.  A yearly low-dose CT scan of the lungs is recommended for people who:  Currently smoke.  Have quit within the past 15 years.  Have at least a 30-pack-year history of smoking. A pack year is smoking an average of one pack of cigarettes a day for 1 year.  Yearly screening should continue until it has been 15 years since you quit.  Yearly screening should stop if you develop a health problem that would prevent you from having lung cancer treatment.  Breast Cancer  Practice breast self-awareness. This means understanding how your breasts normally appear and feel.  It also means doing regular breast self-exams. Let your health care provider know about any changes, no matter how small.  If you are in your 20s or 30s, you should have a clinical breast exam (CBE) by a health care provider every 1-3 years as part of a regular health exam.  If you are 40 or older, have a CBE every year. Also consider having a breast X-ray (mammogram) every year.  If you have a family history of breast cancer, talk to your health care provider about genetic screening.  If you   are at high risk for breast cancer, talk to your health care provider about having an MRI and a mammogram every year.  Breast cancer gene (BRCA) assessment is recommended for women who have family members with BRCA-related cancers. BRCA-related cancers include:  Breast.  Ovarian.  Tubal.  Peritoneal cancers.  Results of the assessment will determine the need for genetic counseling and BRCA1 and BRCA2 testing. Cervical Cancer Your health care provider may recommend that you be screened regularly for cancer of the pelvic organs (ovaries, uterus, and  vagina). This screening involves a pelvic examination, including checking for microscopic changes to the surface of your cervix (Pap test). You may be encouraged to have this screening done every 3 years, beginning at age 21.  For women ages 30-65, health care providers may recommend pelvic exams and Pap testing every 3 years, or they may recommend the Pap and pelvic exam, combined with testing for human papilloma virus (HPV), every 5 years. Some types of HPV increase your risk of cervical cancer. Testing for HPV may also be done on women of any age with unclear Pap test results.  Other health care providers may not recommend any screening for nonpregnant women who are considered low risk for pelvic cancer and who do not have symptoms. Ask your health care provider if a screening pelvic exam is right for you.  If you have had past treatment for cervical cancer or a condition that could lead to cancer, you need Pap tests and screening for cancer for at least 20 years after your treatment. If Pap tests have been discontinued, your risk factors (such as having a new sexual partner) need to be reassessed to determine if screening should resume. Some women have medical problems that increase the chance of getting cervical cancer. In these cases, your health care provider may recommend more frequent screening and Pap tests. Colorectal Cancer  This type of cancer can be detected and often prevented.  Routine colorectal cancer screening usually begins at 38 years of age and continues through 38 years of age.  Your health care provider may recommend screening at an earlier age if you have risk factors for colon cancer.  Your health care provider may also recommend using home test kits to check for hidden blood in the stool.  A small camera at the end of a tube can be used to examine your colon directly (sigmoidoscopy or colonoscopy). This is done to check for the earliest forms of colorectal  cancer.  Routine screening usually begins at age 50.  Direct examination of the colon should be repeated every 5-10 years through 38 years of age. However, you may need to be screened more often if early forms of precancerous polyps or small growths are found. Skin Cancer  Check your skin from head to toe regularly.  Tell your health care provider about any new moles or changes in moles, especially if there is a change in a mole's shape or color.  Also tell your health care provider if you have a mole that is larger than the size of a pencil eraser.  Always use sunscreen. Apply sunscreen liberally and repeatedly throughout the day.  Protect yourself by wearing long sleeves, pants, a wide-brimmed hat, and sunglasses whenever you are outside. HEART DISEASE, DIABETES, AND HIGH BLOOD PRESSURE   High blood pressure causes heart disease and increases the risk of stroke. High blood pressure is more likely to develop in:  People who have blood pressure in the high end   of the normal range (130-139/85-89 mm Hg).  People who are overweight or obese.  People who are African American.  If you are 38-23 years of age, have your blood pressure checked every 3-5 years. If you are 61 years of age or older, have your blood pressure checked every year. You should have your blood pressure measured twice--once when you are at a hospital or clinic, and once when you are not at a hospital or clinic. Record the average of the two measurements. To check your blood pressure when you are not at a hospital or clinic, you can use:  An automated blood pressure machine at a pharmacy.  A home blood pressure monitor.  If you are between 45 years and 39 years old, ask your health care provider if you should take aspirin to prevent strokes.  Have regular diabetes screenings. This involves taking a blood sample to check your fasting blood sugar level.  If you are at a normal weight and have a low risk for diabetes,  have this test once every three years after 38 years of age.  If you are overweight and have a high risk for diabetes, consider being tested at a younger age or more often. PREVENTING INFECTION  Hepatitis B  If you have a higher risk for hepatitis B, you should be screened for this virus. You are considered at high risk for hepatitis B if:  You were born in a country where hepatitis B is common. Ask your health care provider which countries are considered high risk.  Your parents were born in a high-risk country, and you have not been immunized against hepatitis B (hepatitis B vaccine).  You have HIV or AIDS.  You use needles to inject street drugs.  You live with someone who has hepatitis B.  You have had sex with someone who has hepatitis B.  You get hemodialysis treatment.  You take certain medicines for conditions, including cancer, organ transplantation, and autoimmune conditions. Hepatitis C  Blood testing is recommended for:  Everyone born from [birth year] through 1963.  Anyone with known risk factors for hepatitis C. Sexually transmitted infections (STIs)  You should be screened for sexually transmitted infections (STIs) including gonorrhea and chlamydia if:  You are sexually active and are younger than 38 years of age.  You are older than 38 years of age and your health care provider tells you that you are at risk for this type of infection.  Your sexual activity has changed since you were last screened and you are at an increased risk for chlamydia or gonorrhea. Ask your health care provider if you are at risk.  If you do not have HIV, but are at risk, it may be recommended that you take a prescription medicine daily to prevent HIV infection. This is called pre-exposure prophylaxis (PrEP). You are considered at risk if:  You are sexually active and do not regularly use condoms or know the HIV status of your partner(s).  You take drugs by injection.  You are sexually  active with a partner who has HIV. Talk with your health care provider about whether you are at high risk of being infected with HIV. If you choose to begin PrEP, you should first be tested for HIV. You should then be tested every 3 months for as long as you are taking PrEP.  PREGNANCY   If you are premenopausal and you may become pregnant, ask your health care provider about preconception counseling.  If you may  become pregnant, take 400 to 800 micrograms (mcg) of folic acid every day.  If you want to prevent pregnancy, talk to your health care provider about birth control (contraception). OSTEOPOROSIS AND MENOPAUSE   Osteoporosis is a disease in which the bones lose minerals and strength with aging. This can result in serious bone fractures. Your risk for osteoporosis can be identified using a bone density scan.  If you are 61 years of age or older, or if you are at risk for osteoporosis and fractures, ask your health care provider if you should be screened.  Ask your health care provider whether you should take a calcium or vitamin D supplement to lower your risk for osteoporosis.  Menopause may have certain physical symptoms and risks.  Hormone replacement therapy may reduce some of these symptoms and risks. Talk to your health care provider about whether hormone replacement therapy is right for you.  HOME CARE INSTRUCTIONS   Schedule regular health, dental, and eye exams.  Stay current with your immunizations.   Do not use any tobacco products including cigarettes, chewing tobacco, or electronic cigarettes.  If you are pregnant, do not drink alcohol.  If you are breastfeeding, limit how much and how often you drink alcohol.  Limit alcohol intake to no more than 1 drink per day for nonpregnant women. One drink equals 12 ounces of beer, 5 ounces of wine, or 1 ounces of hard liquor.  Do not use street drugs.  Do not share needles.  Ask your health care provider for help if  you need support or information about quitting drugs.  Tell your health care provider if you often feel depressed.  Tell your health care provider if you have ever been abused or do not feel safe at home.   This information is not intended to replace advice given to you by your health care provider. Make sure you discuss any questions you have with your health care provider.   Document Released: 08/09/2010 Document Revised: 02/14/2014 Document Reviewed: 12/26/2012 Elsevier Interactive Patient Education [employer].

## 2015-08-20 LAB — LIPID PANEL
Chol/HDL Ratio: 3 ratio units (ref 0.0–4.4)
Cholesterol, Total: 145 mg/dL (ref 100–199)
HDL: 48 mg/dL (ref >39)
LDL Calculated: 84 mg/dL (ref 0–99)
TRIGLYCERIDES: 66 mg/dL (ref 0–149)
VLDL CHOLESTEROL CAL: 13 mg/dL (ref 5–40)

## 2015-08-20 LAB — CMP14+EGFR
A/G RATIO: 1.3 (ref 1.2–2.2)
ALBUMIN: 4.1 g/dL (ref 3.5–5.5)
ALT: 34 IU/L — AB (ref 0–32)
AST: 25 IU/L (ref 0–40)
Alkaline Phosphatase: 82 IU/L (ref 39–117)
BILIRUBIN TOTAL: 0.3 mg/dL (ref 0.0–1.2)
BUN/Creatinine Ratio: 19 (ref 9–23)
BUN: 12 mg/dL (ref 6–20)
CALCIUM: 8.8 mg/dL (ref 8.7–10.2)
CHLORIDE: 101 mmol/L (ref 96–106)
CO2: 22 mmol/L (ref 18–29)
Creatinine, Ser: 0.64 mg/dL (ref 0.57–1.00)
GFR calc Af Amer: 131 mL/min/{1.73_m2} (ref >59)
GFR calc non Af Amer: 114 mL/min/{1.73_m2} (ref >59)
GLUCOSE: 93 mg/dL (ref 65–99)
Globulin, Total: 3.1 g/dL (ref 1.5–4.5)
POTASSIUM: 4.2 mmol/L (ref 3.5–5.2)
Sodium: 138 mmol/L (ref 134–144)
TOTAL PROTEIN: 7.2 g/dL (ref 6.0–8.5)

## 2015-08-20 LAB — CBC WITH DIFFERENTIAL/PLATELET
BASOS: 0 %
Basophils Absolute: 0 10*3/uL (ref 0.0–0.2)
EOS (ABSOLUTE): 0.1 10*3/uL (ref 0.0–0.4)
Eos: 1 %
HEMOGLOBIN: 12.5 g/dL (ref 11.1–15.9)
Hematocrit: 37.5 % (ref 34.0–46.6)
IMMATURE GRANS (ABS): 0 10*3/uL (ref 0.0–0.1)
Immature Granulocytes: 0 %
LYMPHS: 37 %
Lymphocytes Absolute: 3.5 10*3/uL — ABNORMAL HIGH (ref 0.7–3.1)
MCH: 28.5 pg (ref 26.6–33.0)
MCHC: 33.3 g/dL (ref 31.5–35.7)
MCV: 86 fL (ref 79–97)
MONOCYTES: 8 %
Monocytes Absolute: 0.8 10*3/uL (ref 0.1–0.9)
NEUTROS ABS: 5.2 10*3/uL (ref 1.4–7.0)
Neutrophils: 54 %
Platelets: 182 10*3/uL (ref 150–379)
RBC: 4.38 x10E6/uL (ref 3.77–5.28)
RDW: 13.4 % (ref 12.3–15.4)
WBC: 9.6 10*3/uL (ref 3.4–10.8)

## 2015-08-20 LAB — PAP IG W/ RFLX HPV ASCU: PAP SMEAR COMMENT: 0

## 2015-08-20 LAB — THYROID PANEL WITH TSH
FREE THYROXINE INDEX: 2.2 (ref 1.2–4.9)
T3 Uptake Ratio: 30 % (ref 24–39)
T4, Total: 7.4 ug/dL (ref 4.5–12.0)
TSH: 2.79 u[IU]/mL (ref 0.450–4.500)

## 2015-08-20 LAB — VITAMIN D 25 HYDROXY (VIT D DEFICIENCY, FRACTURES): VIT D 25 HYDROXY: 19.8 ng/mL — AB (ref 30.0–100.0)

## 2015-08-21 ENCOUNTER — Encounter: Payer: BLUE CROSS/BLUE SHIELD | Admitting: Nurse Practitioner

## 2015-10-05 IMAGING — US US PELVIS COMPLETE
1 series · 13 of 25 positions shown · non-contrast
Comparison: None. The outside CT is not available for direct
comparison.

CLINICAL DATA: Acute right sided pelvic pain. By report, patient
had an outside CT at [REDACTED] [facility] showing
a right ovarian cyst.

EXAM:
TRANSABDOMINAL AND TRANSVAGINAL ULTRASOUND OF PELVIS
TECHNIQUE: Both transabdominal and transvaginal ultrasound examinations of the
pelvis were performed. Transabdominal technique was performed for
global imaging of the pelvis including uterus, ovaries, adnexal
regions, and pelvic cul-de-sac. It was necessary to proceed with
endovaginal exam following the transabdominal exam to visualize the
endometrium and ovaries as the bladder was incompletely distended.

[Series 1: us pelvis complete · 0.22mm/px · 13 of 59 slices shown]
[im 1/59]
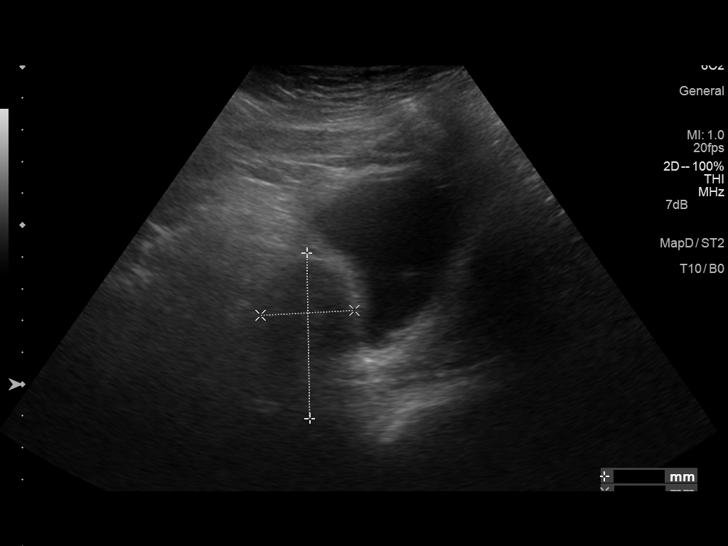
[im 5/59]
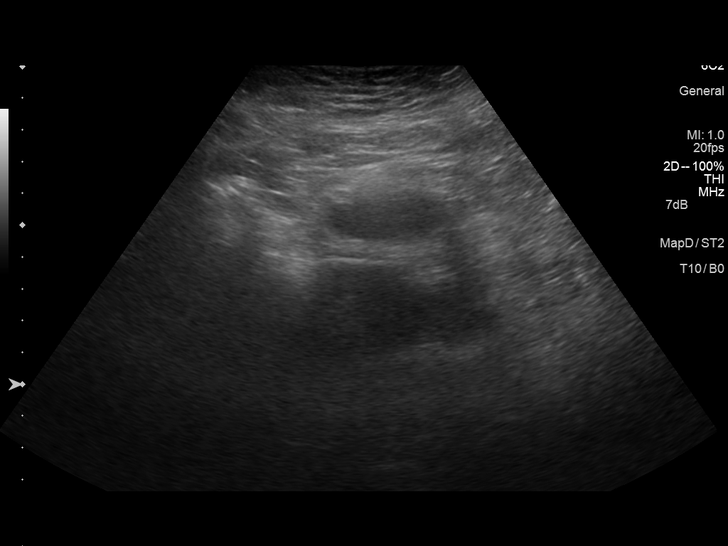
[im 10/59]
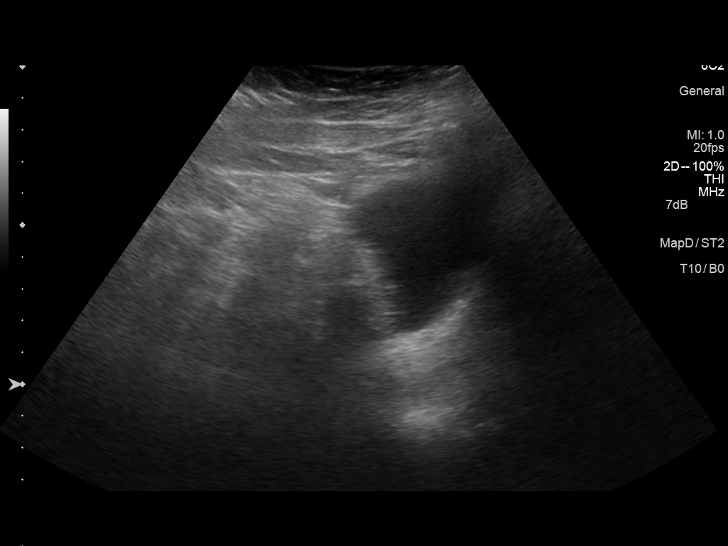
[im 15/59]
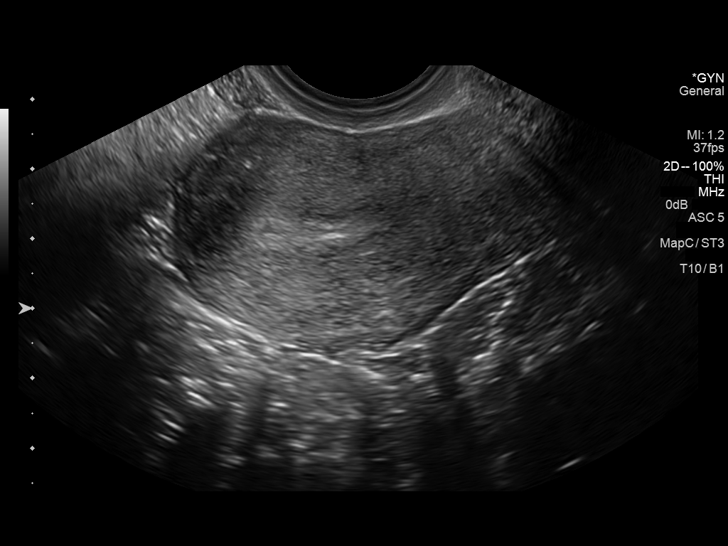
[im 20/59]
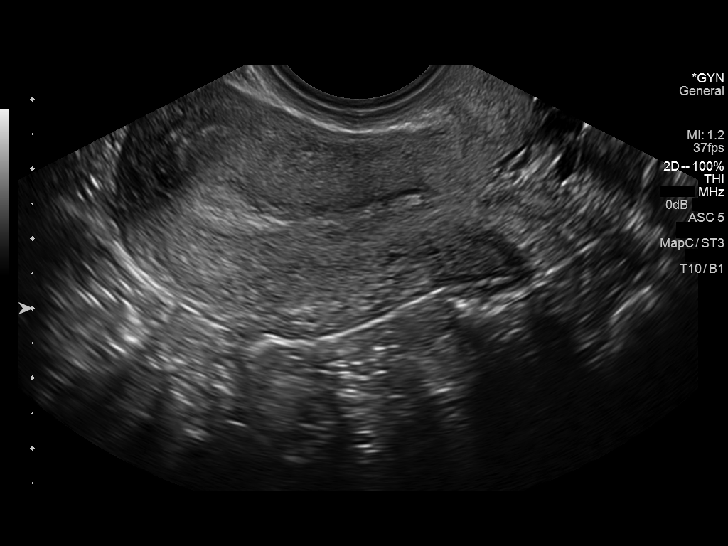
[im 25/59]
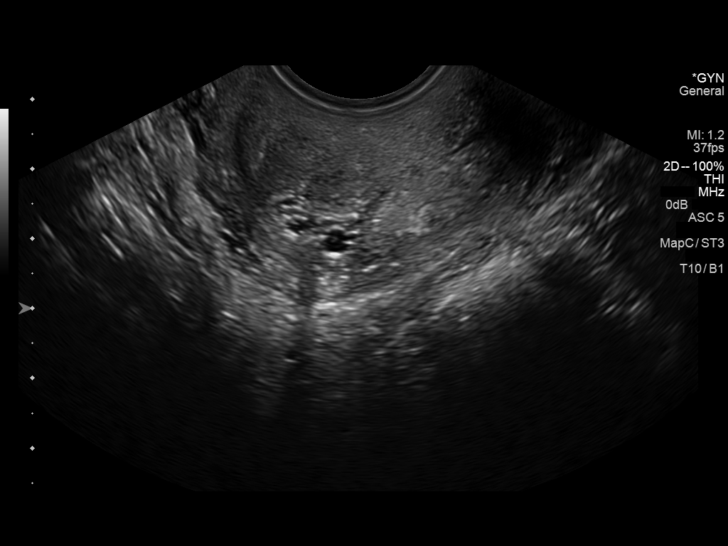
[im 30/59]
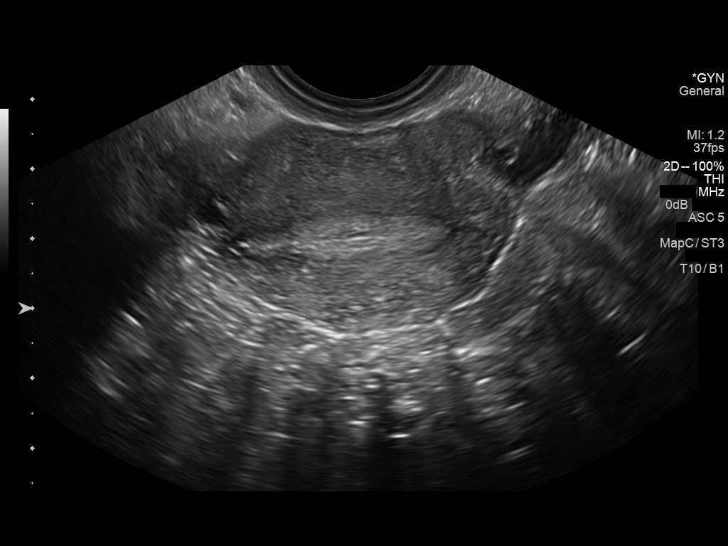
[im 34/59]
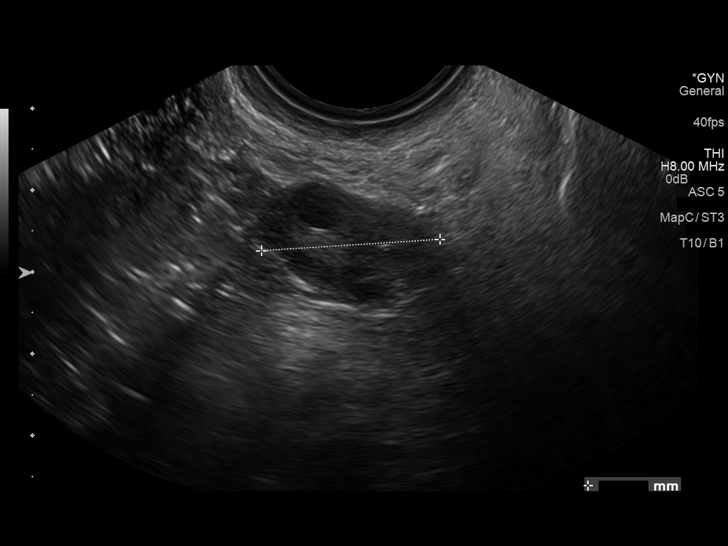
[im 39/59]
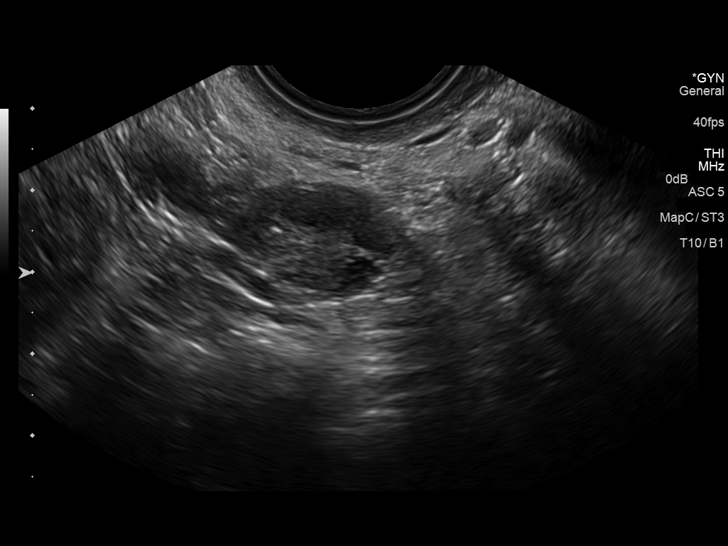
[im 44/59]
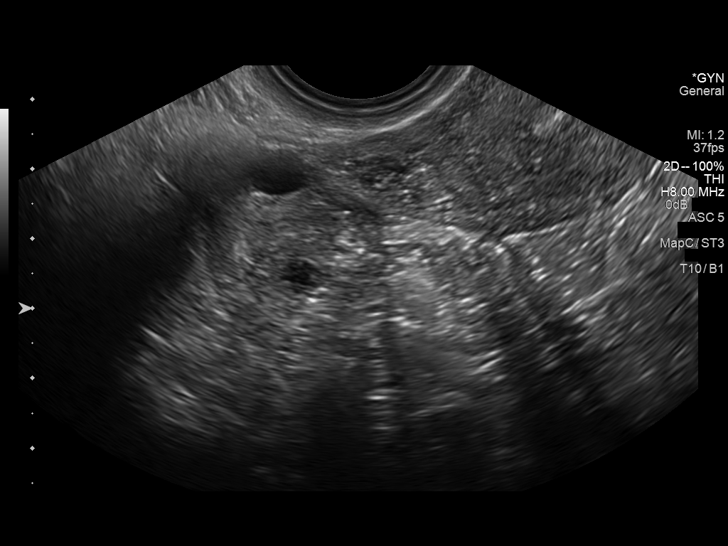
[im 49/59]
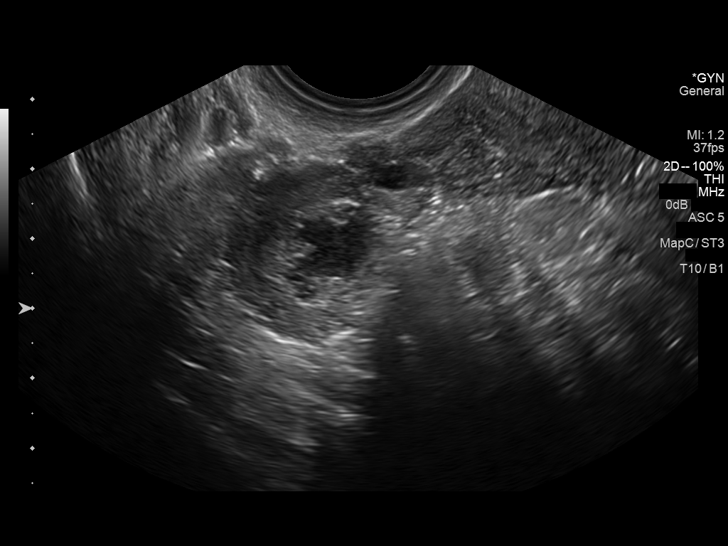
[im 54/59]
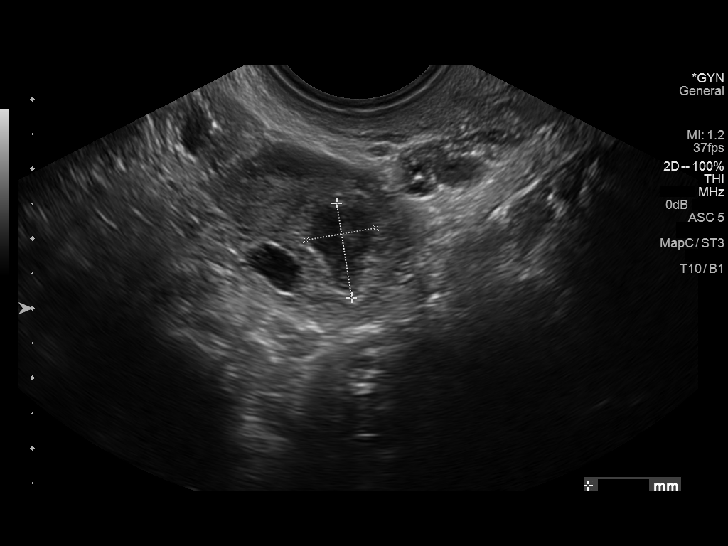
[im 59/59]
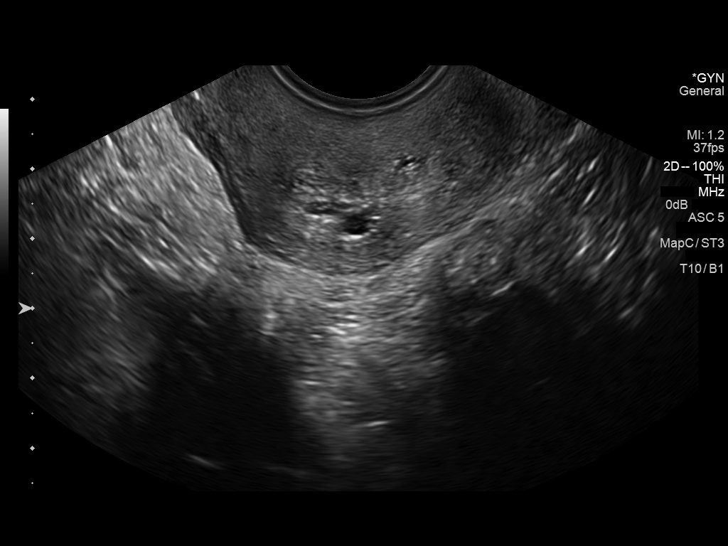

[13 of 25 positions shown; findings below may reference images not displayed]

FINDINGS: Uterus

Measurements: Approximately 8.0 x 3.4 x 4.6 cm. Homogeneous
echotexture without focal fibroid or other myometrial abnormality.
Small nabothian cysts on the uterine cervix.

Endometrium

Thickness: Approximately 6 mm.. Normal appearance without evidence
of endometrial fluid or mass.

Right ovary

Measurements: Approximately 2.8 x 2.4 x 2.3 cm. Multiple small
follicular cysts. Approximate 1.4 x 1.5 x 1.7 cm collapsing cyst
with internal echoes but no internal color Doppler flow. Normal
color Doppler flow within the ovarian tissue.

Left ovary

Measurements: Approximately 2.2 x 1.5 x 2.2 cm. Small follicular
cysts. No dominant cyst or solid mass. Normal color Doppler flow
within the ovary.

Other findings

No free fluid.
IMPRESSION: 1. Collapsing hemorrhagic cyst involving the right ovary measuring
approximately 1.7 cm.
2. Small nabothian cysts on the cervix.
3. Otherwise normal examination.

## 2015-12-26 ENCOUNTER — Ambulatory Visit (INDEPENDENT_AMBULATORY_CARE_PROVIDER_SITE_OTHER): Payer: BLUE CROSS/BLUE SHIELD

## 2015-12-26 DIAGNOSIS — Z23 Encounter for immunization: Secondary | ICD-10-CM | POA: Diagnosis not present

## 2016-05-17 ENCOUNTER — Encounter: Payer: BLUE CROSS/BLUE SHIELD | Admitting: Nurse Practitioner

## 2016-05-30 DIAGNOSIS — M1711 Unilateral primary osteoarthritis, right knee: Secondary | ICD-10-CM | POA: Insufficient documentation

## 2016-06-03 ENCOUNTER — Encounter: Payer: BLUE CROSS/BLUE SHIELD | Admitting: Nurse Practitioner

## 2016-06-10 ENCOUNTER — Encounter: Payer: BLUE CROSS/BLUE SHIELD | Admitting: Nurse Practitioner

## 2016-06-13 ENCOUNTER — Encounter: Payer: Self-pay | Admitting: Nurse Practitioner

## 2016-06-16 ENCOUNTER — Encounter: Payer: BLUE CROSS/BLUE SHIELD | Admitting: Nurse Practitioner

## 2016-06-20 ENCOUNTER — Encounter: Payer: Self-pay | Admitting: Nurse Practitioner

## 2016-06-22 ENCOUNTER — Ambulatory Visit (INDEPENDENT_AMBULATORY_CARE_PROVIDER_SITE_OTHER): Payer: BLUE CROSS/BLUE SHIELD | Admitting: Nurse Practitioner

## 2016-06-22 ENCOUNTER — Encounter: Payer: Self-pay | Admitting: Nurse Practitioner

## 2016-06-22 VITALS — BP 112/72 | HR 81 | Temp 97.8°F | Ht 65.0 in | Wt 187.0 lb

## 2016-06-22 DIAGNOSIS — B373 Candidiasis of vulva and vagina: Secondary | ICD-10-CM

## 2016-06-22 DIAGNOSIS — B3731 Acute candidiasis of vulva and vagina: Secondary | ICD-10-CM

## 2016-06-22 DIAGNOSIS — Z6828 Body mass index (BMI) 28.0-28.9, adult: Secondary | ICD-10-CM | POA: Diagnosis not present

## 2016-06-22 DIAGNOSIS — Z01419 Encounter for gynecological examination (general) (routine) without abnormal findings: Secondary | ICD-10-CM | POA: Diagnosis not present

## 2016-06-22 DIAGNOSIS — Z0000 Encounter for general adult medical examination without abnormal findings: Secondary | ICD-10-CM

## 2016-06-22 LAB — MICROSCOPIC EXAMINATION: Renal Epithel, UA: NONE SEEN /hpf

## 2016-06-22 LAB — URINALYSIS, COMPLETE
BILIRUBIN UA: NEGATIVE
Glucose, UA: NEGATIVE
Ketones, UA: NEGATIVE
Leukocytes, UA: NEGATIVE
NITRITE UA: POSITIVE — AB
PH UA: 5.5 (ref 5.0–7.5)
Protein, UA: NEGATIVE
Specific Gravity, UA: 1.025 (ref 1.005–1.030)
UUROB: 0.2 mg/dL (ref 0.2–1.0)

## 2016-06-22 MED ORDER — FLUCONAZOLE 150 MG PO TABS: 150.0000 mg | ORAL_TABLET | Freq: Once | ORAL | 0 refills | Status: AC

## 2016-06-22 NOTE — Progress Notes (Signed)
   Subjective:    Patient ID: Dawn Caldwell, female    DOB: [birth year], 41 y.o.   MRN: [id number]  HPI Patient comes in today annual physical exam and PAP. She has o current medical problems. She is only on naprosyn that she takes when she has occasional menustral cramps. She is doing well today without coplaints.    Review of Systems  Constitutional: Negative for diaphoresis.  HENT: Negative.   Eyes: Negative for pain.  Respiratory: Negative for shortness of breath.   Cardiovascular: Negative for chest pain, palpitations and leg swelling.  Gastrointestinal: Negative for abdominal pain.  Endocrine: Negative for polydipsia.  Genitourinary: Negative.   Musculoskeletal: Negative.   Skin: Negative for rash.  Neurological: Negative for dizziness, weakness and headaches.  Hematological: Does not bruise/bleed easily.  Psychiatric/Behavioral: Negative.   All other systems reviewed and are negative.      Objective:   Physical Exam  Constitutional: She is oriented to person, place, and time. She appears well-developed and well-nourished.  HENT:  Head: Normocephalic.  Right Ear: Hearing, tympanic membrane, external ear and ear canal normal.  Left Ear: Hearing, tympanic membrane, external ear and ear canal normal.  Nose: Nose normal.  Mouth/Throat: Uvula is midline and oropharynx is clear and moist.  Eyes: Conjunctivae and EOM are normal. Pupils are equal, round, and reactive to light.  Neck: Normal range of motion and full passive range of motion without pain. Neck supple. No JVD present. Carotid bruit is not present. No thyroid mass and no thyromegaly present.  Cardiovascular: Normal rate, normal heart sounds and intact distal pulses.   No murmur heard. Pulmonary/Chest: Effort normal and breath sounds normal. Right breast exhibits no inverted nipple, no mass, no nipple discharge, no skin change and no tenderness. Left breast exhibits no inverted nipple, no mass, no nipple discharge, no  skin change and no tenderness.  Abdominal: Soft. Bowel sounds are normal. She exhibits no mass. There is no tenderness.  Genitourinary: Uterus normal. No breast swelling, tenderness, discharge or bleeding. Vaginal discharge (cheesy vaginal discharge) found.  Genitourinary Comments:  bimanual exam-No adnexal masses or tenderness. Cervix is parous and pink  Musculoskeletal: Normal range of motion.  Lymphadenopathy:    She has no cervical adenopathy.  Neurological: She is alert and oriented to person, place, and time.  Skin: Skin is warm and dry.  Psychiatric: She has a normal mood and affect. Her behavior is normal. Judgment and thought content normal.   BP 112/72   Pulse 81   Temp 97.8 F (36.6 C) (Oral)   Ht _0  (1.651 m)   Wt 187 lb (84.8 kg)   BMI 31.12 kg/m          Assessment & Plan:  1. Annual physical exam - Urinalysis, Complete - CMP14+EGFR - Lipid panel - CBC with Differential/Platelet - Thyroid Panel With TSH - VITAMIN D 25 Hydroxy (Vit-D Deficiency, Fractures)  2. Gynecologic exam normal - IGP, Aptima HPV, rfx 16/18,45  3. BMI 28.0-28.9,adult Discussed diet and exercise for person with BMI >25 Will recheck weight in 3-6 months  4. Vaginal candidiasis No douching Avoid bubble baths - fluconazole (DIFLUCAN) 150 MG tablet; Take 1 tablet (150 mg total) by mouth once.  Dispense: 1 tablet; Refill: 0    Labs pending Health maintenance reviewed Diet and exercise encouraged Continue all meds Follow up  In 1 year   [place], FNP

## 2016-06-22 NOTE — Patient Instructions (Signed)
Health Maintenance, Female Adopting a healthy lifestyle and getting preventive care can go a long way to promote health and wellness. Talk with your health care provider about what schedule of regular examinations is right for you. This is a good chance for you to check in with your provider about disease prevention and staying healthy. In between checkups, there are plenty of things you can do on your own. Experts have done a lot of research about which lifestyle changes and preventive measures are most likely to keep you healthy. Ask your health care provider for more information. Weight and diet Eat a healthy diet  Be sure to include plenty of vegetables, fruits, low-fat dairy products, and lean protein.  Do not eat a lot of foods high in solid fats, added sugars, or salt.  Get regular exercise. This is one of the most important things you can do for your health.  Most adults should exercise for at least 150 minutes each week. The exercise should increase your heart rate and make you sweat (moderate-intensity exercise).  Most adults should also do strengthening exercises at least twice a week. This is in addition to the moderate-intensity exercise. Maintain a healthy weight  Body mass index (BMI) is a measurement that can be used to identify possible weight problems. It estimates body fat based on height and weight. Your health care provider can help determine your BMI and help you achieve or maintain a healthy weight.  For females 76 years of age and older:  A BMI below 18.5 is considered underweight.  A BMI of 18.5 to 24.9 is normal.  A BMI of 25 to 29.9 is considered overweight.  A BMI of 30 and above is considered obese. Watch levels of cholesterol and blood lipids  You should start having your blood tested for lipids and cholesterol at 39 years of age, then have this test every 5 years.  You may need to have your cholesterol levels checked more often if:  Your lipid or  cholesterol levels are high.  You are older than 39 years of age.  You are at high risk for heart disease. Cancer screening Lung Cancer  Lung cancer screening is recommended for adults 64-42 years old who are at high risk for lung cancer because of a history of smoking.  A yearly low-dose CT scan of the lungs is recommended for people who:  Currently smoke.  Have quit within the past 15 years.  Have at least a 30-pack-year history of smoking. A pack year is smoking an average of one pack of cigarettes a day for 1 year.  Yearly screening should continue until it has been 15 years since you quit.  Yearly screening should stop if you develop a health problem that would prevent you from having lung cancer treatment. Breast Cancer  Practice breast self-awareness. This means understanding how your breasts normally appear and feel.  It also means doing regular breast self-exams. Let your health care provider know about any changes, no matter how small.  If you are in your 20s or 30s, you should have a clinical breast exam (CBE) by a health care provider every 1-3 years as part of a regular health exam.  If you are 34 or older, have a CBE every year. Also consider having a breast X-ray (mammogram) every year.  If you have a family history of breast cancer, talk to your health care provider about genetic screening.  If you are at high risk for breast cancer, talk  to your health care provider about having an MRI and a mammogram every year.  Breast cancer gene (BRCA) assessment is recommended for women who have family members with BRCA-related cancers. BRCA-related cancers include:  Breast.  Ovarian.  Tubal.  Peritoneal cancers.  Results of the assessment will determine the need for genetic counseling and BRCA1 and BRCA2 testing. Cervical Cancer  Your health care provider may recommend that you be screened regularly for cancer of the pelvic organs (ovaries, uterus, and vagina).  This screening involves a pelvic examination, including checking for microscopic changes to the surface of your cervix (Pap test). You may be encouraged to have this screening done every 3 years, beginning at age 24.  For women ages 66-65, health care providers may recommend pelvic exams and Pap testing every 3 years, or they may recommend the Pap and pelvic exam, combined with testing for human papilloma virus (HPV), every 5 years. Some types of HPV increase your risk of cervical cancer. Testing for HPV may also be done on women of any age with unclear Pap test results.  Other health care providers may not recommend any screening for nonpregnant women who are considered low risk for pelvic cancer and who do not have symptoms. Ask your health care provider if a screening pelvic exam is right for you.  If you have had past treatment for cervical cancer or a condition that could lead to cancer, you need Pap tests and screening for cancer for at least 20 years after your treatment. If Pap tests have been discontinued, your risk factors (such as having a new sexual partner) need to be reassessed to determine if screening should resume. Some women have medical problems that increase the chance of getting cervical cancer. In these cases, your health care provider may recommend more frequent screening and Pap tests. Colorectal Cancer  This type of cancer can be detected and often prevented.  Routine colorectal cancer screening usually begins at 39 years of age and continues through 39 years of age.  Your health care provider may recommend screening at an earlier age if you have risk factors for colon cancer.  Your health care provider may also recommend using home test kits to check for hidden blood in the stool.  A small camera at the end of a tube can be used to examine your colon directly (sigmoidoscopy or colonoscopy). This is done to check for the earliest forms of colorectal cancer.  Routine  screening usually begins at age 41.  Direct examination of the colon should be repeated every 5-10 years through 39 years of age. However, you may need to be screened more often if early forms of precancerous polyps or small growths are found. Skin Cancer  Check your skin from head to toe regularly.  Tell your health care provider about any new moles or changes in moles, especially if there is a change in a mole's shape or color.  Also tell your health care provider if you have a mole that is larger than the size of a pencil eraser.  Always use sunscreen. Apply sunscreen liberally and repeatedly throughout the day.  Protect yourself by wearing long sleeves, pants, a wide-brimmed hat, and sunglasses whenever you are outside. Heart disease, diabetes, and high blood pressure  High blood pressure causes heart disease and increases the risk of stroke. High blood pressure is more likely to develop in:  People who have blood pressure in the high end of the normal range (130-139/85-89 mm Hg).  People who are overweight or obese.  People who are African American.  If you are 59-24 years of age, have your blood pressure checked every 3-5 years. If you are 34 years of age or older, have your blood pressure checked every year. You should have your blood pressure measured twice-once when you are at a hospital or clinic, and once when you are not at a hospital or clinic. Record the average of the two measurements. To check your blood pressure when you are not at a hospital or clinic, you can use:  An automated blood pressure machine at a pharmacy.  A home blood pressure monitor.  If you are between 29 years and 60 years old, ask your health care provider if you should take aspirin to prevent strokes.  Have regular diabetes screenings. This involves taking a blood sample to check your fasting blood sugar level.  If you are at a normal weight and have a low risk for diabetes, have this test once  every three years after 39 years of age.  If you are overweight and have a high risk for diabetes, consider being tested at a younger age or more often. Preventing infection Hepatitis B  If you have a higher risk for hepatitis B, you should be screened for this virus. You are considered at high risk for hepatitis B if:  You were born in a country where hepatitis B is common. Ask your health care provider which countries are considered high risk.  Your parents were born in a high-risk country, and you have not been immunized against hepatitis B (hepatitis B vaccine).  You have HIV or AIDS.  You use needles to inject street drugs.  You live with someone who has hepatitis B.  You have had sex with someone who has hepatitis B.  You get hemodialysis treatment.  You take certain medicines for conditions, including cancer, organ transplantation, and autoimmune conditions. Hepatitis C  Blood testing is recommended for:  Everyone born from [birth year] through 1963.  Anyone with known risk factors for hepatitis C. Sexually transmitted infections (STIs)  You should be screened for sexually transmitted infections (STIs) including gonorrhea and chlamydia if:  You are sexually active and are younger than 39 years of age.  You are older than 39 years of age and your health care provider tells you that you are at risk for this type of infection.  Your sexual activity has changed since you were last screened and you are at an increased risk for chlamydia or gonorrhea. Ask your health care provider if you are at risk.  If you do not have HIV, but are at risk, it may be recommended that you take a prescription medicine daily to prevent HIV infection. This is called pre-exposure prophylaxis (PrEP). You are considered at risk if:  You are sexually active and do not regularly use condoms or know the HIV status of your partner(s).  You take drugs by injection.  You are sexually active with a partner  who has HIV. Talk with your health care provider about whether you are at high risk of being infected with HIV. If you choose to begin PrEP, you should first be tested for HIV. You should then be tested every 3 months for as long as you are taking PrEP. Pregnancy  If you are premenopausal and you may become pregnant, ask your health care provider about preconception counseling.  If you may become pregnant, take 400 to 800 micrograms (mcg) of folic acid  every day.  If you want to prevent pregnancy, talk to your health care provider about birth control (contraception). Osteoporosis and menopause  Osteoporosis is a disease in which the bones lose minerals and strength with aging. This can result in serious bone fractures. Your risk for osteoporosis can be identified using a bone density scan.  If you are 4 years of age or older, or if you are at risk for osteoporosis and fractures, ask your health care provider if you should be screened.  Ask your health care provider whether you should take a calcium or vitamin D supplement to lower your risk for osteoporosis.  Menopause may have certain physical symptoms and risks.  Hormone replacement therapy may reduce some of these symptoms and risks. Talk to your health care provider about whether hormone replacement therapy is right for you. Follow these instructions at home:  Schedule regular health, dental, and eye exams.  Stay current with your immunizations.  Do not use any tobacco products including cigarettes, chewing tobacco, or electronic cigarettes.  If you are pregnant, do not drink alcohol.  If you are breastfeeding, limit how much and how often you drink alcohol.  Limit alcohol intake to no more than 1 drink per day for nonpregnant women. One drink equals 12 ounces of beer, 5 ounces of wine, or 1 ounces of hard liquor.  Do not use street drugs.  Do not share needles.  Ask your health care provider for help if you need support  or information about quitting drugs.  Tell your health care provider if you often feel depressed.  Tell your health care provider if you have ever been abused or do not feel safe at home. This information is not intended to replace advice given to you by your health care provider. Make sure you discuss any questions you have with your health care provider. Document Released: 08/09/2010 Document Revised: 07/02/2015 Document Reviewed: 10/28/2014 Elsevier Interactive Patient Education  2017 [employer].

## 2016-06-23 ENCOUNTER — Other Ambulatory Visit: Payer: Self-pay | Admitting: Nurse Practitioner

## 2016-06-23 LAB — CMP14+EGFR
ALBUMIN: 4 g/dL (ref 3.5–5.5)
ALK PHOS: 97 IU/L (ref 39–117)
ALT: 47 IU/L — AB (ref 0–32)
AST: 30 IU/L (ref 0–40)
Albumin/Globulin Ratio: 1.2 (ref 1.2–2.2)
BILIRUBIN TOTAL: 0.2 mg/dL (ref 0.0–1.2)
BUN / CREAT RATIO: 19 (ref 9–23)
BUN: 12 mg/dL (ref 6–20)
CHLORIDE: 105 mmol/L (ref 96–106)
CO2: 22 mmol/L (ref 18–29)
CREATININE: 0.62 mg/dL (ref 0.57–1.00)
Calcium: 9 mg/dL (ref 8.7–10.2)
GFR calc non Af Amer: 114 mL/min/{1.73_m2} (ref >59)
GFR, EST AFRICAN AMERICAN: 131 mL/min/{1.73_m2} (ref >59)
GLUCOSE: 108 mg/dL — AB (ref 65–99)
Globulin, Total: 3.4 g/dL (ref 1.5–4.5)
Potassium: 4.2 mmol/L (ref 3.5–5.2)
Sodium: 137 mmol/L (ref 134–144)
TOTAL PROTEIN: 7.4 g/dL (ref 6.0–8.5)

## 2016-06-23 LAB — CBC WITH DIFFERENTIAL/PLATELET
BASOS: 0 %
Basophils Absolute: 0 10*3/uL (ref 0.0–0.2)
EOS (ABSOLUTE): 0.1 10*3/uL (ref 0.0–0.4)
EOS: 1 %
HEMATOCRIT: 39.5 % (ref 34.0–46.6)
HEMOGLOBIN: 13.2 g/dL (ref 11.1–15.9)
Immature Grans (Abs): 0 10*3/uL (ref 0.0–0.1)
Immature Granulocytes: 0 %
LYMPHS ABS: 2.8 10*3/uL (ref 0.7–3.1)
Lymphs: 31 %
MCH: 28.4 pg (ref 26.6–33.0)
MCHC: 33.4 g/dL (ref 31.5–35.7)
MCV: 85 fL (ref 79–97)
MONOS ABS: 0.7 10*3/uL (ref 0.1–0.9)
Monocytes: 8 %
Neutrophils Absolute: 5.4 10*3/uL (ref 1.4–7.0)
Neutrophils: 60 %
Platelets: 203 10*3/uL (ref 150–379)
RBC: 4.65 x10E6/uL (ref 3.77–5.28)
RDW: 14 % (ref 12.3–15.4)
WBC: 9 10*3/uL (ref 3.4–10.8)

## 2016-06-23 LAB — LIPID PANEL
CHOLESTEROL TOTAL: 148 mg/dL (ref 100–199)
Chol/HDL Ratio: 3.4 ratio (ref 0.0–4.4)
HDL: 44 mg/dL (ref >39)
LDL CALC: 83 mg/dL (ref 0–99)
Triglycerides: 103 mg/dL (ref 0–149)
VLDL CHOLESTEROL CAL: 21 mg/dL (ref 5–40)

## 2016-06-23 LAB — THYROID PANEL WITH TSH
FREE THYROXINE INDEX: 2.3 (ref 1.2–4.9)
T3 UPTAKE RATIO: 30 % (ref 24–39)
T4, Total: 7.6 ug/dL (ref 4.5–12.0)
TSH: 2.75 u[IU]/mL (ref 0.450–4.500)

## 2016-06-23 LAB — VITAMIN D 25 HYDROXY (VIT D DEFICIENCY, FRACTURES): VIT D 25 HYDROXY: 16.4 ng/mL — AB (ref 30.0–100.0)

## 2016-06-23 MED ORDER — NITROFURANTOIN MONOHYD MACRO 100 MG PO CAPS: 100.0000 mg | ORAL_CAPSULE | Freq: Two times a day (BID) | ORAL | 0 refills | Status: DC

## 2016-06-28 LAB — IGP, APTIMA HPV, RFX 16/18,45
HPV APTIMA: NEGATIVE
PAP Smear Comment: 0

## 2016-08-04 ENCOUNTER — Encounter: Payer: Self-pay | Admitting: Family Medicine

## 2016-08-04 ENCOUNTER — Ambulatory Visit (INDEPENDENT_AMBULATORY_CARE_PROVIDER_SITE_OTHER): Payer: BLUE CROSS/BLUE SHIELD | Admitting: Family Medicine

## 2016-08-04 VITALS — BP 109/79 | HR 81 | Temp 98.1°F | Ht 65.0 in | Wt 186.0 lb

## 2016-08-04 DIAGNOSIS — J020 Streptococcal pharyngitis: Secondary | ICD-10-CM

## 2016-08-04 MED ORDER — AMOXICILLIN 500 MG PO TABS: 500.0000 mg | ORAL_TABLET | Freq: Two times a day (BID) | ORAL | 0 refills | Status: DC

## 2016-08-04 NOTE — Progress Notes (Signed)
BP 109/79   Pulse 81   Temp 98.1 F (36.7 C) (Oral)   Ht 5\' 5"  (1.651 m)   Wt 186 lb (84.4 kg)   BMI 30.95 kg/m    Subjective:    Patient ID: Dawn Caldwell, female    DOB: 08-05-1975, 41 y.o.   MRN: [id number]  HPI: Dawn Caldwell is a 41 y.o. female presenting on 08/04/2016 for Sore throat, bilateral ear pain (x 3 days, husband diagnosed with strep yesterday)   HPI Sore throat and ear pressure Patient has been having sore throat and ear pressure that has been going on for the past 2 days. Her husband was diagnosed with strep about 3 or 4 days ago and then she started up with symptoms the next day. She has not had any fevers or chills has a sore throat with no cough and pressure in both ears. She has been having some nasal drainage and postnasal drainage but mostly it is just in her throat. She has been using Tylenol but nothing else yet.  Relevant past medical, surgical, family and social history reviewed and updated as indicated. Interim medical history since our last visit reviewed. Allergies and medications reviewed and updated.  Review of Systems  Constitutional: Negative for chills and fever.  HENT: Positive for congestion, postnasal drip, rhinorrhea and sore throat. Negative for ear discharge, ear pain, sinus pressure and sneezing.   Eyes: Negative for pain, redness and visual disturbance.  Respiratory: Negative for cough, chest tightness and shortness of breath.   Cardiovascular: Negative for chest pain and leg swelling.  Genitourinary: Negative for difficulty urinating and dysuria.  Musculoskeletal: Negative for back pain and gait problem.  Skin: Negative for rash.  Neurological: Negative for light-headedness and headaches.  Psychiatric/Behavioral: Negative for agitation and behavioral problems.  All other systems reviewed and are negative.   Per HPI unless specifically indicated above        Objective:    BP 109/79   Pulse 81   Temp 98.1 F (36.7 C)  (Oral)   Ht 5\' 5"  (1.651 m)   Wt 186 lb (84.4 kg)   BMI 30.95 kg/m   Wt Readings from Last 3 Encounters:  08/04/16 186 lb (84.4 kg)  06/22/16 187 lb (84.8 kg)  08/19/15 173 lb (78.5 kg)    Physical Exam  Constitutional: She is oriented to person, place, and time. She appears well-developed and well-nourished. No distress.  HENT:  Right Ear: Tympanic membrane, external ear and ear canal normal.  Left Ear: Tympanic membrane, external ear and ear canal normal.  Nose: Mucosal edema and rhinorrhea present. No epistaxis. Right sinus exhibits no maxillary sinus tenderness and no frontal sinus tenderness. Left sinus exhibits no maxillary sinus tenderness and no frontal sinus tenderness.  Mouth/Throat: Uvula is midline and mucous membranes are normal. Oropharyngeal exudate, posterior oropharyngeal edema and posterior oropharyngeal erythema present. No tonsillar abscesses.  Eyes: Conjunctivae and EOM are normal.  Cardiovascular: Normal rate, regular rhythm, normal heart sounds and intact distal pulses.   No murmur heard. Pulmonary/Chest: Effort normal and breath sounds normal. No respiratory distress. She has no wheezes.  Musculoskeletal: Normal range of motion. She exhibits no edema or tenderness.  Neurological: She is alert and oriented to person, place, and time. Coordination normal.  Skin: Skin is warm and dry. No rash noted. She is not diaphoretic.  Psychiatric: She has a normal mood and affect. Her behavior is normal.  Vitals reviewed.       Assessment &  Plan:   Problem List Items Addressed This Visit    None    Visit Diagnoses    Pharyngitis due to Streptococcus species    -  Primary   Relevant Medications   amoxicillin (AMOXIL) 500 MG tablet       Follow up plan: Return if symptoms worsen or fail to improve.  Counseling provided for all of the vaccine components No orders of the defined types were placed in this encounter.   Arville CareJoshua Dettinger, MD [facility] 08/04/2016, 4:26 PM

## 2016-11-30 ENCOUNTER — Ambulatory Visit (INDEPENDENT_AMBULATORY_CARE_PROVIDER_SITE_OTHER): Payer: BLUE CROSS/BLUE SHIELD

## 2016-11-30 DIAGNOSIS — Z23 Encounter for immunization: Secondary | ICD-10-CM | POA: Diagnosis not present

## 2017-02-07 HISTORY — PX: GALLBLADDER SURGERY: SHX652

## 2017-07-21 ENCOUNTER — Encounter: Payer: Self-pay | Admitting: Nurse Practitioner

## 2017-07-21 ENCOUNTER — Ambulatory Visit (INDEPENDENT_AMBULATORY_CARE_PROVIDER_SITE_OTHER): Payer: BLUE CROSS/BLUE SHIELD | Admitting: Nurse Practitioner

## 2017-07-21 VITALS — BP 109/77 | HR 80 | Temp 98.1°F | Ht 65.0 in | Wt 189.0 lb

## 2017-07-21 DIAGNOSIS — Z1231 Encounter for screening mammogram for malignant neoplasm of breast: Secondary | ICD-10-CM

## 2017-07-21 DIAGNOSIS — Z0000 Encounter for general adult medical examination without abnormal findings: Secondary | ICD-10-CM

## 2017-07-21 DIAGNOSIS — Z01419 Encounter for gynecological examination (general) (routine) without abnormal findings: Secondary | ICD-10-CM

## 2017-07-21 LAB — MICROSCOPIC EXAMINATION: Renal Epithel, UA: NONE SEEN /hpf

## 2017-07-21 LAB — URINALYSIS, COMPLETE
BILIRUBIN UA: NEGATIVE
Glucose, UA: NEGATIVE
Ketones, UA: NEGATIVE
Nitrite, UA: NEGATIVE
PH UA: 5.5 (ref 5.0–7.5)
PROTEIN UA: NEGATIVE
Specific Gravity, UA: 1.025 (ref 1.005–1.030)
UUROB: 0.2 mg/dL (ref 0.2–1.0)

## 2017-07-21 NOTE — Patient Instructions (Signed)

## 2017-07-21 NOTE — Progress Notes (Signed)
   Subjective:    Patient ID: Enos Fling, female    DOB: 10/19/1975, 42 y.o.   MRN: [id number]   Chief Complaint: Annual Exam   HPI:  1. Annual physical exam  Patient in today for complete physical exam and PAP    Outpatient Encounter Medications as of 07/21/2017  Medication Sig  . naproxen (NAPROSYN) 375 MG tablet Take 1 tablet (375 mg total) by mouth 2 (two) times daily.       New complaints: None today  Social history: Works at [employer]  Constitutional: Negative for activity change and appetite change.  HENT: Negative.   Eyes: Negative for pain.  Respiratory: Negative for shortness of breath.   Cardiovascular: Negative for chest pain, palpitations and leg swelling.  Gastrointestinal: Negative for abdominal pain.  Endocrine: Negative for polydipsia.  Genitourinary: Negative.   Skin: Negative for rash.  Neurological: Negative for dizziness, weakness and headaches.  Hematological: Does not bruise/bleed easily.  Psychiatric/Behavioral: Negative.   All other systems reviewed and are negative.      Objective:   Physical Exam  Constitutional: She is oriented to person, place, and time.  HENT:  Head: Normocephalic.  Nose: Nose normal.  Mouth/Throat: Oropharynx is clear and moist.  Eyes: Pupils are equal, round, and reactive to light. EOM are normal.  Neck: Normal range of motion. Neck supple. No JVD present. Carotid bruit is not present.  Cardiovascular: Normal rate, regular rhythm, normal heart sounds and intact distal pulses.  Pulmonary/Chest: Effort normal and breath sounds normal. No respiratory distress. She has no wheezes. She has no rales. She exhibits no tenderness. Right breast exhibits no inverted nipple, no mass, no nipple discharge, no skin change and no tenderness. Left breast exhibits no inverted nipple, no mass, no nipple discharge, no skin change and no tenderness.  Abdominal: Soft. Normal appearance, normal aorta  and bowel sounds are normal. She exhibits no distension, no abdominal bruit, no pulsatile midline mass and no mass. There is no splenomegaly or hepatomegaly. There is no tenderness.  Genitourinary: Vagina normal and uterus normal. No vaginal discharge found.  Musculoskeletal: Normal range of motion. She exhibits no edema.  Lymphadenopathy:    She has no cervical adenopathy.  Neurological: She is alert and oriented to person, place, and time. She has normal reflexes.  Skin: Skin is warm and dry.  Psychiatric: She has a normal mood and affect. Her behavior is normal. Judgment and thought content normal.  Nursing note and vitals reviewed.  BP 109/77   Pulse 80   Temp 98.1 F (36.7 C) (Oral)   Ht _0  (1.651 m)   Wt 189 lb (85.7 kg)   BMI 31.45 kg/m        Assessment & Plan:  Delana A Valverde comes in today with chief complaint of Annual Exam   Diagnosis and orders addressed:  1. Annual physical exam - Urinalysis, Complete - CMP14+EGFR - Lipid panel - Thyroid Panel With TSH - CBC with Differential/Platelet  2. Gynecologic exam normal - IGP, Aptima HPV, rfx 16/18,45   Labs pending Health Maintenance reviewed Diet and exercise encouraged  Follow up plan: 1 year   Ginger Blue, FNP

## 2017-07-22 LAB — CBC WITH DIFFERENTIAL/PLATELET
BASOS ABS: 0 10*3/uL (ref 0.0–0.2)
Basos: 0 %
EOS (ABSOLUTE): 0.1 10*3/uL (ref 0.0–0.4)
Eos: 1 %
HEMOGLOBIN: 14 g/dL (ref 11.1–15.9)
Hematocrit: 42 % (ref 34.0–46.6)
IMMATURE GRANULOCYTES: 0 %
Immature Grans (Abs): 0 10*3/uL (ref 0.0–0.1)
LYMPHS ABS: 2.8 10*3/uL (ref 0.7–3.1)
Lymphs: 29 %
MCH: 28.3 pg (ref 26.6–33.0)
MCHC: 33.3 g/dL (ref 31.5–35.7)
MCV: 85 fL (ref 79–97)
MONOCYTES: 8 %
Monocytes Absolute: 0.7 10*3/uL (ref 0.1–0.9)
NEUTROS PCT: 62 %
Neutrophils Absolute: 5.9 10*3/uL (ref 1.4–7.0)
Platelets: 210 10*3/uL (ref 150–450)
RBC: 4.95 x10E6/uL (ref 3.77–5.28)
RDW: 13.3 % (ref 12.3–15.4)
WBC: 9.5 10*3/uL (ref 3.4–10.8)

## 2017-07-22 LAB — CMP14+EGFR
A/G RATIO: 1.4 (ref 1.2–2.2)
ALK PHOS: 102 IU/L (ref 39–117)
ALT: 40 IU/L — AB (ref 0–32)
AST: 27 IU/L (ref 0–40)
Albumin: 4.3 g/dL (ref 3.5–5.5)
BILIRUBIN TOTAL: 0.3 mg/dL (ref 0.0–1.2)
BUN/Creatinine Ratio: 18 (ref 9–23)
BUN: 11 mg/dL (ref 6–24)
CHLORIDE: 102 mmol/L (ref 96–106)
CO2: 22 mmol/L (ref 20–29)
Calcium: 9.2 mg/dL (ref 8.7–10.2)
Creatinine, Ser: 0.62 mg/dL (ref 0.57–1.00)
GFR calc non Af Amer: 113 mL/min/{1.73_m2} (ref >59)
GFR, EST AFRICAN AMERICAN: 130 mL/min/{1.73_m2} (ref >59)
GLUCOSE: 94 mg/dL (ref 65–99)
Globulin, Total: 3 g/dL (ref 1.5–4.5)
POTASSIUM: 4.6 mmol/L (ref 3.5–5.2)
Sodium: 138 mmol/L (ref 134–144)
Total Protein: 7.3 g/dL (ref 6.0–8.5)

## 2017-07-22 LAB — LIPID PANEL
Chol/HDL Ratio: 3.9 ratio (ref 0.0–4.4)
Cholesterol, Total: 171 mg/dL (ref 100–199)
HDL: 44 mg/dL (ref >39)
LDL Calculated: 102 mg/dL — ABNORMAL HIGH (ref 0–99)
TRIGLYCERIDES: 123 mg/dL (ref 0–149)
VLDL CHOLESTEROL CAL: 25 mg/dL (ref 5–40)

## 2017-07-22 LAB — THYROID PANEL WITH TSH
FREE THYROXINE INDEX: 1.7 (ref 1.2–4.9)
T3 Uptake Ratio: 24 % (ref 24–39)
T4, Total: 7.2 ug/dL (ref 4.5–12.0)
TSH: 1.88 u[IU]/mL (ref 0.450–4.500)

## 2017-07-26 LAB — IGP, APTIMA HPV, RFX 16/18,45
HPV Aptima: NEGATIVE
PAP Smear Comment: 0

## 2017-08-04 ENCOUNTER — Ambulatory Visit: Payer: BLUE CROSS/BLUE SHIELD | Admitting: Nurse Practitioner

## 2017-08-04 ENCOUNTER — Encounter: Payer: Self-pay | Admitting: Nurse Practitioner

## 2017-08-04 ENCOUNTER — Other Ambulatory Visit: Payer: Self-pay | Admitting: Nurse Practitioner

## 2017-08-04 VITALS — BP 122/87 | HR 80 | Temp 97.7°F | Ht 65.0 in | Wt 187.0 lb

## 2017-08-04 DIAGNOSIS — N946 Dysmenorrhea, unspecified: Secondary | ICD-10-CM

## 2017-08-04 DIAGNOSIS — H65192 Other acute nonsuppurative otitis media, left ear: Secondary | ICD-10-CM | POA: Diagnosis not present

## 2017-08-04 DIAGNOSIS — J301 Allergic rhinitis due to pollen: Secondary | ICD-10-CM | POA: Diagnosis not present

## 2017-08-04 MED ORDER — FLUTICASONE PROPIONATE 50 MCG/ACT NA SUSP: 2.0000 | Freq: Every day | NASAL | 6 refills | Status: DC

## 2017-08-04 NOTE — Patient Instructions (Signed)

## 2017-08-04 NOTE — Progress Notes (Signed)
   Subjective:    Patient ID: Dawn Caldwell, female    DOB: [birth year], 42 y.o.   MRN: [id number]   Chief Complaint: Ear Pain; Cough; Sore Throat; and Nasal Congestion   HPI Patient comes in today c/o sore throat, congestion, and ear pain. Started about 4 days ago. Has been taking severe sinus by alkaseltzer.   Review of Systems  Constitutional: Positive for fatigue. Negative for chills and fever.  HENT: Positive for congestion, ear pain, rhinorrhea, sore throat and trouble swallowing.   Respiratory: Positive for cough (sight).   Gastrointestinal: Negative.   Neurological: Negative.   Psychiatric/Behavioral: Negative.   All other systems reviewed and are negative.      Objective:   Physical Exam  Constitutional: She is oriented to person, place, and time. She appears well-developed and well-nourished.  HENT:  Right Ear: Hearing, tympanic membrane and ear canal normal.  Left Ear: Hearing and ear canal normal. A middle ear effusion is present.  Mouth/Throat: Uvula is midline, oropharynx is clear and moist and mucous membranes are normal. No oral lesions. No uvula swelling. No oropharyngeal exudate.  Eyes: Pupils are equal, round, and reactive to light. EOM are normal.  Neck: Normal range of motion. Neck supple.  Cardiovascular: Normal rate.  Pulmonary/Chest: Effort normal and breath sounds normal.  Neurological: She is alert and oriented to person, place, and time.  Skin: Skin is warm and dry.  Psychiatric: She has a normal mood and affect. Her behavior is normal.    BP 122/87   Pulse 80   Temp 97.7 F (36.5 C) (Oral)   Ht 5\' 5"  (1.651 m)   Wt 187 lb (84.8 kg)   BMI 31.12 kg/m        Assessment & Plan:  Dawn Caldwell in today with chief complaint of Ear Pain; Cough; Sore Throat; and Nasal Congestion   1. Seasonal allergic rhinitis due to pollen - fluticasone (FLONASE) 50 MCG/ACT nasal spray; Place 2 sprays into both nostrils daily.  Dispense: 16 g; Refill:  6  2. Acute MEE (middle ear effusion), left - fluticasone (FLONASE) 50 MCG/ACT nasal spray; Place 2 sprays into both nostrils daily.  Dispense: 16 g; Refill: 6 Force fluids claritin OTC Run humidifier Meds ordered this encounter  Medications  . fluticasone (FLONASE) 50 MCG/ACT nasal spray    Sig: Place 2 sprays into both nostrils daily.    Dispense:  16 g    Refill:  6    Order Specific Question:   Supervising Provider    Answer:   Johna SheriffVINCENT, CAROL L [[id number]]   Mary-Margaret Daphine DeutscherMartin, FNP

## 2017-09-09 ENCOUNTER — Other Ambulatory Visit: Payer: Self-pay | Admitting: Nurse Practitioner

## 2017-09-09 DIAGNOSIS — N946 Dysmenorrhea, unspecified: Secondary | ICD-10-CM

## 2017-10-04 ENCOUNTER — Telehealth: Payer: Self-pay | Admitting: Nurse Practitioner

## 2017-10-04 ENCOUNTER — Other Ambulatory Visit: Payer: Self-pay | Admitting: *Deleted

## 2017-10-04 DIAGNOSIS — Z1231 Encounter for screening mammogram for malignant neoplasm of breast: Secondary | ICD-10-CM

## 2017-10-04 DIAGNOSIS — Z0000 Encounter for general adult medical examination without abnormal findings: Secondary | ICD-10-CM

## 2017-10-04 NOTE — Telephone Encounter (Signed)
Referral has been placed. Patient aware   

## 2017-10-04 NOTE — Telephone Encounter (Signed)
[facility] Please advise.

## 2017-11-07 NOTE — Progress Notes (Signed)
Pt scheduled on [facility] bus 02/02/18; Letter sent with appointment date/time

## 2017-11-14 ENCOUNTER — Ambulatory Visit (INDEPENDENT_AMBULATORY_CARE_PROVIDER_SITE_OTHER): Payer: BLUE CROSS/BLUE SHIELD

## 2017-11-14 DIAGNOSIS — Z23 Encounter for immunization: Secondary | ICD-10-CM | POA: Diagnosis not present

## 2017-11-23 ENCOUNTER — Encounter: Payer: Self-pay | Admitting: Family Medicine

## 2017-11-23 ENCOUNTER — Ambulatory Visit: Payer: BLUE CROSS/BLUE SHIELD | Admitting: Family Medicine

## 2017-11-23 VITALS — BP 117/78 | HR 77 | Temp 98.6°F | Ht 65.0 in | Wt 178.0 lb

## 2017-11-23 DIAGNOSIS — R590 Localized enlarged lymph nodes: Secondary | ICD-10-CM | POA: Diagnosis not present

## 2017-11-23 DIAGNOSIS — J029 Acute pharyngitis, unspecified: Secondary | ICD-10-CM | POA: Diagnosis not present

## 2017-11-23 LAB — CULTURE, GROUP A STREP

## 2017-11-23 LAB — RAPID STREP SCREEN (MED CTR MEBANE ONLY): Strep Gp A Ag, IA W/Reflex: NEGATIVE

## 2017-11-23 MED ORDER — AMOXICILLIN 500 MG PO CAPS: 500.0000 mg | ORAL_CAPSULE | Freq: Three times a day (TID) | ORAL | 0 refills | Status: DC

## 2017-11-23 NOTE — Progress Notes (Signed)
Subjective: CC: sore throat PCP: Bennie Pierini, FNP ONG:EXBMW A Bocchino is a 42 y.o. female presenting to clinic today for:  1. Sore throat Patient reports onset of sore throat, right ear pain and an enlarged lymph node behind her right ear within the last couple of days.  Yesterday, she notes that the lymph node was quite large.  She took Advil and it does seem to make it smaller.  Denies any fevers, chills, headache, nausea.  No known sick contacts.  She is felt "unwell".  Last treatment for strep was in 2018.   ROS: Per HPI  Allergies  Allergen Reactions  . Codeine   . E-Mycin [Erythromycin]    Past Medical History:  Diagnosis Date  . Allergy   . Asthma     Current Outpatient Medications:  .  naproxen (NAPROSYN) 375 MG tablet, TAKE 1 TABLET (375 MG TOTAL) BY MOUTH 2 (TWO) TIMES DAILY., Disp: 60 tablet, Rfl: 1 .  fluticasone (FLONASE) 50 MCG/ACT nasal spray, Place 2 sprays into both nostrils daily. (Patient not taking: Reported on 11/23/2017), Disp: 16 g, Rfl: 6 Social History   Socioeconomic History  . Marital status: Married    Spouse name: Not on file  . Number of children: Not on file  . Years of education: Not on file  . Highest education level: Not on file  Occupational History  . Not on file  Social Needs  . Financial resource strain: Not on file  . Food insecurity:    Worry: Not on file    Inability: Not on file  . Transportation needs:    Medical: Not on file    Non-medical: Not on file  Tobacco Use  . Smoking status: Never Smoker  . Smokeless tobacco: Never Used  Substance and Sexual Activity  . Alcohol use: No  . Drug use: No  . Sexual activity: Not on file  Lifestyle  . Physical activity:    Days per week: Not on file    Minutes per session: Not on file  . Stress: Not on file  Relationships  . Social connections:    Talks on phone: Not on file    Gets together: Not on file    Attends religious service: Not on file    Active member  of club or organization: Not on file    Attends meetings of clubs or organizations: Not on file    Relationship status: Not on file  . Intimate partner violence:    Fear of current or ex partner: Not on file    Emotionally abused: Not on file    Physically abused: Not on file    Forced sexual activity: Not on file  Other Topics Concern  . Not on file  Social History Narrative  . Not on file   Family History  Problem Relation Age of Onset  . Diabetes Mother   . Hypertension Mother   . Diabetes Father     Objective: Office vital signs reviewed. BP 117/78   Pulse 77   Temp 98.6 F (37 C) (Oral)   Ht 5\' 5"  (1.651 m)   Wt 178 lb (80.7 kg)   BMI 29.62 kg/m   Physical Examination:  General: Awake, alert, well nourished, No acute distress HEENT: Normal    Neck: No masses palpated. ~1 cm palpable cervical lymph node on the right.  +TTP.    Ears: Tympanic membranes intact, normal light reflex, no erythema, no bulging    Eyes: PERRLA, extraocular membranes  intact, sclera white    Nose: nasal turbinates moist, no nasal discharge    Throat: moist mucus membranes, moderate oropharyngeal erythema, right tonsil is enlarged and tonsillar exudate is present on that side.  Airway is patent Cardio: regular rate and rhythm, S1S2 heard, no murmurs appreciated Pulm: clear to auscultation bilaterally, no wheezes, rhonchi or rales; normal work of breathing on room air  Assessment/ Plan: 42 y.o. female   1. Sore throat Patient is afebrile nontoxic-appearing.  Rapid strep was negative but given associated lymph node enlargement I have empirically treated her with amoxicillin p.o. 3 times daily for the next 10 days.  Home care instructions were reviewed with the patient.  Reasons for return discussed.  Given the enlarged lymph node, I did ask that she follow-up in 2 weeks for recheck.  If persistently enlarged, low threshold for evaluation with imaging to r/o malignant process like lymphoma. -  Rapid Strep Screen ([facility] ONLY)  2. Enlarged lymph node in neck As above.   Orders Placed This Encounter  Procedures  . Rapid Strep Screen ([facility] ONLY)   Meds ordered this encounter  Medications  . amoxicillin (AMOXIL) 500 MG capsule    Sig: Take 1 capsule (500 mg total) by mouth 3 (three) times daily.    Dispense:  30 capsule    Refill:  0     Amea Mcphail Hulen Skains, DO [facility] [phone]

## 2017-11-23 NOTE — Patient Instructions (Signed)
I am putting you on amoxicillin by mouth 3 times a day for the next 10 days.  I would like you to be rechecked in about 2 weeks to make sure that this lymph node has completely gone away.  If not, we may need to consider further evaluation.  Okay to continue Aleve or ibuprofen if needed for pain and swelling.  Use salt water gargles several times daily.  If symptoms worsen, you develop high fevers, difficulty swallowing or staying hydrated, please seek immediate medical attention.   Pharyngitis Pharyngitis is redness, pain, and swelling (inflammation) of the throat (pharynx). It is a very common cause of sore throat. Pharyngitis can be caused by a bacteria, but it is usually caused by a virus. Most cases of pharyngitis get better on their own without treatment. What are the causes? This condition may be caused by:  Infection by viruses (viral). Viral pharyngitis spreads from person to person (is contagious) through coughing, sneezing, and sharing of personal items or utensils such as cups, forks, spoons, and toothbrushes.  Infection by bacteria (bacterial). Bacterial pharyngitis may be spread by touching the nose or face after coming in contact with the bacteria, or through more intimate contact, such as kissing.  Allergies. Allergies can cause buildup of mucus in the throat (post-nasal drip), leading to inflammation and irritation. Allergies can also cause blocked nasal passages, forcing breathing through the mouth, which dries and irritates the throat.  What increases the risk? You are more likely to develop this condition if:  You are 46-55 years old.  You are exposed to crowded environments such as daycare, school, or dormitory living.  You live in a cold climate.  You have a weakened disease-fighting (immune) system.  What are the signs or symptoms? Symptoms of this condition vary by the cause (viral, bacterial, or allergies) and can include:  Sore throat.  Fatigue.  Low-grade  fever.  Headache.  Joint pain and muscle aches.  Skin rashes.  Swollen glands in the throat (lymph nodes).  Plaque-like film on the throat or tonsils. This is often a symptom of bacterial pharyngitis.  Vomiting.  Stuffy nose (nasal congestion).  Cough.  Red, itchy eyes (conjunctivitis).  Loss of appetite.  How is this diagnosed? This condition is often diagnosed based on your medical history and a physical exam. Your health care provider will ask you questions about your illness and your symptoms. A swab of your throat may be done to check for bacteria (rapid strep test). Other lab tests may also be done, depending on the suspected cause, but these are rare. How is this treated? This condition usually gets better in 3-4 days without medicine. Bacterial pharyngitis may be treated with antibiotic medicines. Follow these instructions at home:  Take over-the-counter and prescription medicines only as told by your health care provider. ? If you were prescribed an antibiotic medicine, take it as told by your health care provider. Do not stop taking the antibiotic even if you start to feel better. ? Do not give children aspirin because of the association with Reye syndrome.  Drink enough water and fluids to keep your urine clear or pale yellow.  Get a lot of rest.  Gargle with a salt-water mixture 3-4 times a day or as needed. To make a salt-water mixture, completely dissolve -1 tsp of salt in 1 cup of warm water.  If your health care provider approves, you may use throat lozenges or sprays to soothe your throat. Contact a health care provider  if:  You have large, tender lumps in your neck.  You have a rash.  You cough up green, yellow-brown, or bloody spit. Get help right away if:  Your neck becomes stiff.  You drool or are unable to swallow liquids.  You cannot drink or take medicines without vomiting.  You have severe pain that does not go away, even after you take  medicine.  You have trouble breathing, and it is not caused by a stuffy nose.  You have new pain and swelling in your joints such as the knees, ankles, wrists, or elbows. Summary  Pharyngitis is redness, pain, and swelling (inflammation) of the throat (pharynx).  While pharyngitis can be caused by a bacteria, the most common causes are viral.  Most cases of pharyngitis get better on their own without treatment.  Bacterial pharyngitis is treated with antibiotic medicines. This information is not intended to replace advice given to you by your health care provider. Make sure you discuss any questions you have with your health care provider. Document Released: 01/24/2005 Document Revised: 03/01/2016 Document Reviewed: 03/01/2016 Elsevier Interactive Patient Education  [employer].

## 2017-12-11 ENCOUNTER — Ambulatory Visit: Payer: BLUE CROSS/BLUE SHIELD | Admitting: Family Medicine

## 2018-02-02 DIAGNOSIS — Z1231 Encounter for screening mammogram for malignant neoplasm of breast: Secondary | ICD-10-CM | POA: Diagnosis not present

## 2018-02-02 LAB — HM MAMMOGRAPHY

## 2018-03-19 DIAGNOSIS — M25522 Pain in left elbow: Secondary | ICD-10-CM | POA: Diagnosis not present

## 2018-03-19 DIAGNOSIS — Z885 Allergy status to narcotic agent status: Secondary | ICD-10-CM | POA: Diagnosis not present

## 2018-03-19 DIAGNOSIS — M7712 Lateral epicondylitis, left elbow: Secondary | ICD-10-CM | POA: Diagnosis not present

## 2018-03-19 DIAGNOSIS — Z881 Allergy status to other antibiotic agents status: Secondary | ICD-10-CM | POA: Diagnosis not present

## 2018-05-14 DIAGNOSIS — M7712 Lateral epicondylitis, left elbow: Secondary | ICD-10-CM | POA: Diagnosis not present

## 2018-06-18 DIAGNOSIS — M7712 Lateral epicondylitis, left elbow: Secondary | ICD-10-CM | POA: Diagnosis not present

## 2018-08-23 ENCOUNTER — Encounter: Payer: Self-pay | Admitting: Family

## 2018-08-23 ENCOUNTER — Ambulatory Visit (INDEPENDENT_AMBULATORY_CARE_PROVIDER_SITE_OTHER): Payer: BC Managed Care – PPO | Admitting: Family

## 2018-08-23 ENCOUNTER — Other Ambulatory Visit: Payer: Self-pay

## 2018-08-23 DIAGNOSIS — M546 Pain in thoracic spine: Secondary | ICD-10-CM

## 2018-08-23 MED ORDER — BACLOFEN 10 MG PO TABS: 10.0000 mg | ORAL_TABLET | Freq: Three times a day (TID) | ORAL | 0 refills | Status: DC

## 2018-08-23 MED ORDER — NAPROXEN 375 MG PO TABS: 375.0000 mg | ORAL_TABLET | Freq: Two times a day (BID) | ORAL | 1 refills | Status: DC

## 2018-08-23 NOTE — Progress Notes (Signed)
   Virtual Visit via telephone Note  I connected with Dawn Caldwell on 08/23/18 at 2:18 pm by telephone and verified that I am speaking with the correct person using two identifiers. Dawn Caldwell is currently located at driving and husband is currently with her during visit. The provider, Evelina Dun, FNP is located in their office at time of visit.  I discussed the limitations, risks, security and privacy concerns of performing an evaluation and management service by telephone and the availability of in person appointments. I also discussed with the patient that there may be a patient responsible charge related to this service. The patient expressed understanding and agreed to proceed.   History and Present Illness:  Back Pain This is a new problem. The current episode started yesterday. The problem occurs constantly. The problem has been waxing and waning since onset. The pain is present in the thoracic spine. The quality of the pain is described as burning. The pain does not radiate. The pain is at a severity of 4/10 (8 when she moves). The pain is moderate. The symptoms are aggravated by bending, standing and twisting. Pertinent negatives include no bladder incontinence, bowel incontinence, headaches, leg pain, numbness, paresis, paresthesias, tingling or weakness. She has tried NSAIDs for the symptoms. The treatment provided mild relief.      Review of Systems  Gastrointestinal: Negative for bowel incontinence.  Genitourinary: Negative for bladder incontinence.  Musculoskeletal: Positive for back pain.  Neurological: Negative for tingling, weakness, numbness, headaches and paresthesias.  All other systems reviewed and are negative.    Observations/Objective: No SOB or distress noted  Assessment and Plan: 1. Acute bilateral thoracic back pain Rest Ice  ROM exercises encouraged RTO if symptom worsen or do not improve  - naproxen (NAPROSYN) 375 MG tablet; Take 1 tablet (375  mg total) by mouth 2 (two) times daily.  Dispense: 60 tablet; Refill: 1 - baclofen (LIORESAL) 10 MG tablet; Take 1 tablet (10 mg total) by mouth 3 (three) times daily.  Dispense: 30 each; Refill: 0     I discussed the assessment and treatment plan with the patient. The patient was provided an opportunity to ask questions and all were answered. The patient agreed with the plan and demonstrated an understanding of the instructions.   The patient was advised to call back or seek an in-person evaluation if the symptoms worsen or if the condition fails to improve as anticipated.  The above assessment and management plan was discussed with the patient. The patient verbalized understanding of and has agreed to the management plan. Patient is aware to call the clinic if symptoms persist or worsen. Patient is aware when to return to the clinic for a follow-up visit. Patient educated on when it is appropriate to go to the emergency department.   Time call ended:  2:27 pm  I provided 9 minutes of non-face-to-face time during this encounter.    Evelina Dun, FNP

## 2018-09-03 ENCOUNTER — Encounter: Payer: Self-pay | Admitting: Physician Assistant

## 2018-09-03 ENCOUNTER — Other Ambulatory Visit: Payer: BLUE CROSS/BLUE SHIELD

## 2018-09-03 ENCOUNTER — Other Ambulatory Visit: Payer: Self-pay

## 2018-09-03 ENCOUNTER — Ambulatory Visit (INDEPENDENT_AMBULATORY_CARE_PROVIDER_SITE_OTHER): Payer: BC Managed Care – PPO | Admitting: Physician Assistant

## 2018-09-03 DIAGNOSIS — Z20822 Contact with and (suspected) exposure to covid-19: Secondary | ICD-10-CM

## 2018-09-03 DIAGNOSIS — Z20828 Contact with and (suspected) exposure to other viral communicable diseases: Secondary | ICD-10-CM

## 2018-09-03 NOTE — Progress Notes (Signed)
     Telephone visit  Subjective: CC: COVID exposure PCP: Chevis Pretty, FNP XKG:YJEHU A Makar is a 43 y.o. female calls for telephone consult today. Patient provides verbal consent for consult held via phone.  Patient is identified with 2 separate identifiers.  At this time the entire area is on COVID-19 social distancing and stay home orders are in place.  Patient is of higher risk and therefore we are performing this by a virtual method.  Location of patient: Home Location of provider: [facility] Others present for call: Husband and 2 children   The patient reports that she has been out of work a couple of days her husband had a cover test that was positive.  So now she and her children do need to get tested.  She will not be able to return to work until she is tested negative.  Standard letter for being out related to positive and negative COVID results has been given to her.    ROS: Per HPI  Allergies  Allergen Reactions  . Codeine   . E-Mycin [Erythromycin]    Past Medical History:  Diagnosis Date  . Allergy   . Asthma     Current Outpatient Medications:  .  baclofen (LIORESAL) 10 MG tablet, Take 1 tablet (10 mg total) by mouth 3 (three) times daily., Disp: 30 each, Rfl: 0 .  fluticasone (FLONASE) 50 MCG/ACT nasal spray, Place 2 sprays into both nostrils daily. (Patient not taking: Reported on 11/23/2017), Disp: 16 g, Rfl: 6 .  naproxen (NAPROSYN) 375 MG tablet, Take 1 tablet (375 mg total) by mouth 2 (two) times daily., Disp: 60 tablet, Rfl: 1  Assessment/ Plan: 43 y.o. female   1. Exposure to Covid-19 Virus - Novel Coronavirus, NAA ([facility])   No follow-ups on file.  Continue all other maintenance medications as listed above.  Start time: 12:52 PM End time: 1:03 PM  No orders of the defined types were placed in this encounter.   Particia Nearing PA-C [place] [phone]

## 2018-09-05 LAB — NOVEL CORONAVIRUS, NAA: SARS-CoV-2, NAA: NOT DETECTED

## 2018-09-07 ENCOUNTER — Telehealth: Payer: Self-pay | Admitting: Nurse Practitioner

## 2018-09-07 NOTE — Telephone Encounter (Signed)
Patient aware that husband will need to be symptom free before returning to work.

## 2018-09-07 NOTE — Telephone Encounter (Signed)
She cannot go back to work until he is symptom free because of exposure

## 2018-09-07 NOTE — Telephone Encounter (Signed)
Patient states she does not have or had any symptoms

## 2018-11-12 ENCOUNTER — Telehealth: Payer: Self-pay | Admitting: Nurse Practitioner

## 2018-11-13 ENCOUNTER — Ambulatory Visit (INDEPENDENT_AMBULATORY_CARE_PROVIDER_SITE_OTHER): Payer: BC Managed Care – PPO | Admitting: Nurse Practitioner

## 2018-11-13 ENCOUNTER — Other Ambulatory Visit: Payer: Self-pay

## 2018-11-13 ENCOUNTER — Encounter: Payer: Self-pay | Admitting: Nurse Practitioner

## 2018-11-13 VITALS — BP 115/76 | HR 56 | Temp 98.6°F | Resp 16 | Ht 65.0 in | Wt 185.0 lb

## 2018-11-13 DIAGNOSIS — Z0001 Encounter for general adult medical examination with abnormal findings: Secondary | ICD-10-CM | POA: Diagnosis not present

## 2018-11-13 DIAGNOSIS — Z23 Encounter for immunization: Secondary | ICD-10-CM | POA: Diagnosis not present

## 2018-11-13 DIAGNOSIS — Z0000 Encounter for general adult medical examination without abnormal findings: Secondary | ICD-10-CM

## 2018-11-13 DIAGNOSIS — Z6828 Body mass index (BMI) 28.0-28.9, adult: Secondary | ICD-10-CM | POA: Diagnosis not present

## 2018-11-13 DIAGNOSIS — Z136 Encounter for screening for cardiovascular disorders: Secondary | ICD-10-CM | POA: Diagnosis not present

## 2018-11-13 LAB — MICROSCOPIC EXAMINATION
Epithelial Cells (non renal): >10 /hpf — AB (ref 0–10)
Renal Epithel, UA: NONE SEEN /hpf

## 2018-11-13 LAB — URINALYSIS, COMPLETE
Bilirubin, UA: NEGATIVE
Glucose, UA: NEGATIVE
Ketones, UA: NEGATIVE
Leukocytes,UA: NEGATIVE
Nitrite, UA: NEGATIVE
Protein,UA: NEGATIVE
Specific Gravity, UA: 1.025 (ref 1.005–1.030)
Urobilinogen, Ur: 0.2 mg/dL (ref 0.2–1.0)
pH, UA: 6 (ref 5.0–7.5)

## 2018-11-13 NOTE — Progress Notes (Signed)
Subjective:    Patient ID: Dawn Caldwell, female    DOB: May 24, 1975, 43 y.o.   MRN: [id number]   Chief Complaint: Annual Exam    HPI:  1. BMI 28.0-28.9,adult No recent weight gain Wt Readings from Last 3 Encounters:  11/23/17 178 lb (80.7 kg)  08/04/17 187 lb (84.8 kg)  07/21/17 189 lb (85.7 kg)   BMI Readings from Last 3 Encounters:  11/23/17 29.62 kg/m  08/04/17 31.12 kg/m  07/21/17 31.45 kg/m       Outpatient Encounter Medications as of 11/13/2018  Medication Sig  . baclofen (LIORESAL) 10 MG tablet Take 1 tablet (10 mg total) by mouth 3 (three) times daily.  . fluticasone (FLONASE) 50 MCG/ACT nasal spray Place 2 sprays into both nostrils daily. (Patient not taking: Reported on 11/23/2017)  . naproxen (NAPROSYN) 375 MG tablet Take 1 tablet (375 mg total) by mouth 2 (two) times daily.    No past surgical history on file.  Family History  Problem Relation Age of Onset  . Diabetes Mother   . Hypertension Mother   . Diabetes Father     New complaints: None today  Social history: Lives with husband and 2 children  Controlled substance contract: n/a    Review of Systems  Constitutional: Negative for activity change and appetite change.  HENT: Negative.   Eyes: Negative for pain.  Respiratory: Negative for shortness of breath.   Cardiovascular: Negative for chest pain, palpitations and leg swelling.  Gastrointestinal: Negative for abdominal pain.  Endocrine: Negative for polydipsia.  Genitourinary: Negative.   Skin: Negative for rash.  Neurological: Negative for dizziness, weakness and headaches.  Hematological: Does not bruise/bleed easily.  Psychiatric/Behavioral: Negative.   All other systems reviewed and are negative.      Objective:   Physical Exam Vitals signs and nursing note reviewed.  Constitutional:      General: She is not in acute distress.    Appearance: Normal appearance. She is well-developed.  HENT:     Head: Normocephalic.      Nose: Nose normal.  Eyes:     Pupils: Pupils are equal, round, and reactive to light.  Neck:     Musculoskeletal: Normal range of motion and neck supple.     Vascular: No carotid bruit or JVD.  Cardiovascular:     Rate and Rhythm: Normal rate and regular rhythm.     Heart sounds: Normal heart sounds.  Pulmonary:     Effort: Pulmonary effort is normal. No respiratory distress.     Breath sounds: Normal breath sounds. No wheezing or rales.  Chest:     Chest wall: No tenderness.  Abdominal:     General: Bowel sounds are normal. There is no distension or abdominal bruit.     Palpations: Abdomen is soft. There is no hepatomegaly, splenomegaly, mass or pulsatile mass.     Tenderness: There is no abdominal tenderness.  Genitourinary:    General: Normal vulva.     Vagina: No vaginal discharge.     Rectum: Normal. Guaiac result negative.     Comments: Cervix non parous and pink No adnexal masses or tenderness Musculoskeletal: Normal range of motion.  Lymphadenopathy:     Cervical: No cervical adenopathy.  Skin:    General: Skin is warm and dry.  Neurological:     Mental Status: She is alert and oriented to person, place, and time.     Deep Tendon Reflexes: Reflexes are normal and symmetric.  Psychiatric:  Behavior: Behavior normal.        Thought Content: Thought content normal.        Judgment: Judgment normal.    BP 115/76   Pulse (!) 56   Temp 98.6 F (37 C) (Temporal)   Resp 16   Ht '5\' 5"'  (1.651 m)   Wt 185 lb (83.9 kg)   SpO2 98%   BMI 30.79 kg/m         Assessment & Plan:  Dawn Caldwell comes in today with chief complaint of Annual Exam   Diagnosis and orders addressed:  1. BMI 28.0-28.9,adult Discussed diet and exercise for person with BMI >25 Will recheck weight in 3-6 months   2. Annual physical exam - IGP, rfx Aptima HPV ASCU - IGP, Aptima HPV, rfx 16/18,45 - CMP14+EGFR - Lipid panel - Thyroid Panel With TSH - CBC with  Differential/Platelet - Urinalysis, Complete   Labs pending Health Maintenance reviewed Diet and exercise encouraged  Follow up plan: 1 year   Mary-Margaret Hassell Done, FNP

## 2018-11-13 NOTE — Addendum Note (Signed)
Addended by: Chevis Pretty on: 11/13/2018 12:04 PM   Modules accepted: Orders

## 2018-11-13 NOTE — Patient Instructions (Signed)
Exercising to Stay Healthy To become healthy and stay healthy, it is recommended that you do moderate-intensity and vigorous-intensity exercise. You can tell that you are exercising at a moderate intensity if your heart starts beating faster and you start breathing faster but can still hold a conversation. You can tell that you are exercising at a vigorous intensity if you are breathing much harder and faster and cannot hold a conversation while exercising. Exercising regularly is important. It has many health benefits, such as:  Improving overall fitness, flexibility, and endurance.  Increasing bone density.  Helping with weight control.  Decreasing body fat.  Increasing muscle strength.  Reducing stress and tension.  Improving overall health. How often should I exercise? Choose an activity that you enjoy, and set realistic goals. Your health care provider can help you make an activity plan that works for you. Exercise regularly as told by your health care provider. This may include:  Doing strength training two times a week, such as: ? Lifting weights. ? Using resistance bands. ? Push-ups. ? Sit-ups. ? Yoga.  Doing a certain intensity of exercise for a given amount of time. Choose from these options: ? A total of 150 minutes of moderate-intensity exercise every week. ? A total of 75 minutes of vigorous-intensity exercise every week. ? A mix of moderate-intensity and vigorous-intensity exercise every week. Children, pregnant women, people who have not exercised regularly, people who are overweight, and older adults may need to talk with a health care provider about what activities are safe to do. If you have a medical condition, be sure to talk with your health care provider before you start a new exercise program. What are some exercise ideas? Moderate-intensity exercise ideas include:  Walking 1 mile (1.6 km) in about 15 minutes.  Biking.  Hiking.  Golfing.  Dancing.   Water aerobics. Vigorous-intensity exercise ideas include:  Walking 4.5 miles (7.2 km) or more in about 1 hour.  Jogging or running 5 miles (8 km) in about 1 hour.  Biking 10 miles (16.1 km) or more in about 1 hour.  Lap swimming.  Roller-skating or in-line skating.  Cross-country skiing.  Vigorous competitive sports, such as football, basketball, and soccer.  Jumping rope.  Aerobic dancing. What are some everyday activities that can help me to get exercise?  Yard work, such as: ? Pushing a lawn mower. ? Raking and bagging leaves.  Washing your car.  Pushing a stroller.  Shoveling snow.  Gardening.  Washing windows or floors. How can I be more active in my day-to-day activities?  Use stairs instead of an elevator.  Take a walk during your lunch break.  If you drive, park your car farther away from your work or school.  If you take public transportation, get off one stop early and walk the rest of the way.  Stand up or walk around during all of your indoor phone calls.  Get up, stretch, and walk around every 30 minutes throughout the day.  Enjoy exercise with a friend. Support to continue exercising will help you keep a regular routine of activity. What guidelines can I follow while exercising?  Before you start a new exercise program, talk with your health care provider.  Do not exercise so much that you hurt yourself, feel dizzy, or get very short of breath.  Wear comfortable clothes and wear shoes with good support.  Drink plenty of water while you exercise to prevent dehydration or heat stroke.  Work out until your breathing   and your heartbeat get faster. Where to find more information  [place] Department of Health and Human Services: www.hhs.gov  Centers for Disease Control and Prevention (CDC): www.cdc.gov Summary  Exercising regularly is important. It will improve your overall fitness, flexibility, and endurance.  Regular exercise also will  improve your overall health. It can help you control your weight, reduce stress, and improve your bone density.  Do not exercise so much that you hurt yourself, feel dizzy, or get very short of breath.  Before you start a new exercise program, talk with your health care provider. This information is not intended to replace advice given to you by your health care provider. Make sure you discuss any questions you have with your health care provider. Document Released: 02/26/2010 Document Revised: 01/06/2017 Document Reviewed: 12/15/2016 Elsevier Patient Education  2020 [employer]  

## 2018-11-14 LAB — CMP14+EGFR
ALT: 21 IU/L (ref 0–32)
AST: 22 IU/L (ref 0–40)
Albumin/Globulin Ratio: 1.3 (ref 1.2–2.2)
Albumin: 4 g/dL (ref 3.8–4.8)
Alkaline Phosphatase: 98 IU/L (ref 39–117)
BUN/Creatinine Ratio: 13 (ref 9–23)
BUN: 8 mg/dL (ref 6–24)
Bilirubin Total: 0.4 mg/dL (ref 0.0–1.2)
CO2: 20 mmol/L (ref 20–29)
Calcium: 8.9 mg/dL (ref 8.7–10.2)
Chloride: 105 mmol/L (ref 96–106)
Creatinine, Ser: 0.63 mg/dL (ref 0.57–1.00)
GFR calc Af Amer: 129 mL/min/{1.73_m2} (ref >59)
GFR calc non Af Amer: 112 mL/min/{1.73_m2} (ref >59)
Globulin, Total: 3.1 g/dL (ref 1.5–4.5)
Glucose: 91 mg/dL (ref 65–99)
Potassium: 4.4 mmol/L (ref 3.5–5.2)
Sodium: 139 mmol/L (ref 134–144)
Total Protein: 7.1 g/dL (ref 6.0–8.5)

## 2018-11-14 LAB — CBC WITH DIFFERENTIAL/PLATELET
Basophils Absolute: 0 10*3/uL (ref 0.0–0.2)
Basos: 1 %
EOS (ABSOLUTE): 0 10*3/uL (ref 0.0–0.4)
Eos: 1 %
Hematocrit: 39.4 % (ref 34.0–46.6)
Hemoglobin: 13.3 g/dL (ref 11.1–15.9)
Immature Grans (Abs): 0 10*3/uL (ref 0.0–0.1)
Immature Granulocytes: 0 %
Lymphocytes Absolute: 2.3 10*3/uL (ref 0.7–3.1)
Lymphs: 33 %
MCH: 27.7 pg (ref 26.6–33.0)
MCHC: 33.8 g/dL (ref 31.5–35.7)
MCV: 82 fL (ref 79–97)
Monocytes Absolute: 0.4 10*3/uL (ref 0.1–0.9)
Monocytes: 6 %
Neutrophils Absolute: 4.1 10*3/uL (ref 1.4–7.0)
Neutrophils: 59 %
Platelets: 185 10*3/uL (ref 150–450)
RBC: 4.8 x10E6/uL (ref 3.77–5.28)
RDW: 12.7 % (ref 11.7–15.4)
WBC: 6.8 10*3/uL (ref 3.4–10.8)

## 2018-11-14 LAB — THYROID PANEL WITH TSH
Free Thyroxine Index: 2.2 (ref 1.2–4.9)
T3 Uptake Ratio: 28 % (ref 24–39)
T4, Total: 7.9 ug/dL (ref 4.5–12.0)
TSH: 1.26 u[IU]/mL (ref 0.450–4.500)

## 2018-11-14 LAB — LIPID PANEL
Chol/HDL Ratio: 3.7 ratio (ref 0.0–4.4)
Cholesterol, Total: 158 mg/dL (ref 100–199)
HDL: 43 mg/dL (ref >39)
LDL Chol Calc (NIH): 97 mg/dL (ref 0–99)
Triglycerides: 94 mg/dL (ref 0–149)
VLDL Cholesterol Cal: 18 mg/dL (ref 5–40)

## 2018-11-16 LAB — IGP, APTIMA HPV, RFX 16/18,45: HPV Aptima: NEGATIVE

## 2019-01-18 ENCOUNTER — Other Ambulatory Visit: Payer: Self-pay

## 2019-01-21 ENCOUNTER — Other Ambulatory Visit: Payer: Self-pay

## 2019-01-21 ENCOUNTER — Ambulatory Visit: Payer: BC Managed Care – PPO | Admitting: Family Medicine

## 2019-01-21 ENCOUNTER — Encounter: Payer: Self-pay | Admitting: Family Medicine

## 2019-01-21 VITALS — BP 96/60 | HR 69 | Temp 98.4°F | Resp 20 | Ht 65.0 in | Wt 187.0 lb

## 2019-01-21 DIAGNOSIS — M545 Low back pain, unspecified: Secondary | ICD-10-CM

## 2019-01-21 DIAGNOSIS — G8929 Other chronic pain: Secondary | ICD-10-CM | POA: Insufficient documentation

## 2019-01-21 DIAGNOSIS — M62830 Muscle spasm of back: Secondary | ICD-10-CM

## 2019-01-21 MED ORDER — CYCLOBENZAPRINE HCL 5 MG PO TABS: 5.0000 mg | ORAL_TABLET | Freq: Three times a day (TID) | ORAL | 0 refills | Status: AC | PRN

## 2019-01-21 MED ORDER — PREDNISONE 20 MG PO TABS: ORAL_TABLET | ORAL | 0 refills | Status: DC

## 2019-01-21 NOTE — Patient Instructions (Signed)
Acute Back Pain, Adult Acute back pain is sudden and usually short-lived. It is often caused by an injury to the muscles and tissues in the back. The injury may result from:  A muscle or ligament getting overstretched or torn (strained). Ligaments are tissues that connect bones to each other. Lifting something improperly can cause a back strain.  Wear and tear (degeneration) of the spinal disks. Spinal disks are circular tissue that provides cushioning between the bones of the spine (vertebrae).  Twisting motions, such as while playing sports or doing yard work.  A hit to the back.  Arthritis. You may have a physical exam, lab tests, and imaging tests to find the cause of your pain. Acute back pain usually goes away with rest and home care. Follow these instructions at home: Managing pain, stiffness, and swelling  Take over-the-counter and prescription medicines only as told by your health care provider.  Your health care provider may recommend applying ice during the first 24-48 hours after your pain starts. To do this: ? Put ice in a plastic bag. ? Place a towel between your skin and the bag. ? Leave the ice on for 20 minutes, 2-3 times a day.  If directed, apply heat to the affected area as often as told by your health care provider. Use the heat source that your health care provider recommends, such as a moist heat pack or a heating pad. ? Place a towel between your skin and the heat source. ? Leave the heat on for 20-30 minutes. ? Remove the heat if your skin turns bright red. This is especially important if you are unable to feel pain, heat, or cold. You have a greater risk of getting burned. Activity   Do not stay in bed. Staying in bed for more than 1-2 days can delay your recovery.  Sit up and stand up straight. Avoid leaning forward when you sit, or hunching over when you stand. ? If you work at a desk, sit close to it so you do not need to lean over. Keep your chin tucked  in. Keep your neck drawn back, and keep your elbows bent at a right angle. Your arms should look like the letter "L." ? Sit high and close to the steering wheel when you drive. Add lower back (lumbar) support to your car seat, if needed.  Take short walks on even surfaces as soon as you are able. Try to increase the length of time you walk each day.  Do not sit, drive, or stand in one place for more than 30 minutes at a time. Sitting or standing for long periods of time can put stress on your back.  Do not drive or use heavy machinery while taking prescription pain medicine.  Use proper lifting techniques. When you bend and lift, use positions that put less stress on your back: ? Bend your knees. ? Keep the load close to your body. ? Avoid twisting.  Exercise regularly as told by your health care provider. Exercising helps your back heal faster and helps prevent back injuries by keeping muscles strong and flexible.  Work with a physical therapist to make a safe exercise program, as recommended by your health care provider. Do any exercises as told by your physical therapist. Lifestyle  Maintain a healthy weight. Extra weight puts stress on your back and makes it difficult to have good posture.  Avoid activities or situations that make you feel anxious or stressed. Stress and anxiety increase muscle   tension and can make back pain worse. Learn ways to manage anxiety and stress, such as through exercise. General instructions  Sleep on a firm mattress in a comfortable position. Try lying on your side with your knees slightly bent. If you lie on your back, put a pillow under your knees.  Follow your treatment plan as told by your health care provider. This may include: ? Cognitive or behavioral therapy. ? Acupuncture or massage therapy. ? Meditation or yoga. Contact a health care provider if:  You have pain that is not relieved with rest or medicine.  You have increasing pain going down  into your legs or buttocks.  Your pain does not improve after 2 weeks.  You have pain at night.  You lose weight without trying.  You have a fever or chills. Get help right away if:  You develop new bowel or bladder control problems.  You have unusual weakness or numbness in your arms or legs.  You develop nausea or vomiting.  You develop abdominal pain.  You feel faint. Summary  Acute back pain is sudden and usually short-lived.  Use proper lifting techniques. When you bend and lift, use positions that put less stress on your back.  Take over-the-counter and prescription medicines and apply heat or ice as directed by your health care provider. This information is not intended to replace advice given to you by your health care provider. Make sure you discuss any questions you have with your health care provider. Document Released: 01/24/2005 Document Revised: 05/15/2018 Document Reviewed: 09/07/2016 Elsevier Patient Education  2020 [employer]  

## 2019-01-21 NOTE — Progress Notes (Signed)
Subjective:  Patient ID: Dawn Caldwell, female    DOB: May 25, 1975, 43 y.o.   MRN: [id number]  Patient Care Team: Chevis Pretty, FNP as PCP - General (Nurse Practitioner)   Chief Complaint:  Back Pain (lower - since Thanksgiving)   HPI: Dawn Caldwell is a 43 y.o. female presenting on 01/21/2019 for Back Pain (lower - since Thanksgiving)   Back Pain This is a new problem. The current episode started more than 1 month ago. The problem occurs daily. The problem has been waxing and waning since onset. The pain is present in the lumbar spine. The quality of the pain is described as aching and burning. The pain does not radiate. The pain is at a severity of 4/10. The pain is mild. The pain is worse during the day. The symptoms are aggravated by bending, lying down and twisting. Stiffness is present in the morning. Pertinent negatives include no abdominal pain, bladder incontinence, bowel incontinence, chest pain, dysuria, fever, headaches, leg pain, numbness, paresis, paresthesias, pelvic pain, perianal numbness, tingling, weakness or weight loss. She has tried NSAIDs, ice, muscle relaxant and home exercises for the symptoms. The treatment provided mild relief.     Relevant past medical, surgical, family, and social history reviewed and updated as indicated.  Allergies and medications reviewed and updated. Date reviewed: Chart in Epic.   Past Medical History:  Diagnosis Date  . Allergy   . Asthma     Past Surgical History:  Procedure Laterality Date  . GALLBLADDER SURGERY  2019    Social History   Socioeconomic History  . Marital status: Married    Spouse name: Not on file  . Number of children: Not on file  . Years of education: Not on file  . Highest education level: Not on file  Occupational History  . Not on file  Tobacco Use  . Smoking status: Never Smoker  . Smokeless tobacco: Never Used  Substance and Sexual Activity  . Alcohol use: No  . Drug use: No    . Sexual activity: Not on file  Other Topics Concern  . Not on file  Social History Narrative  . Not on file   Social Determinants of Health   Financial Resource Strain:   . Difficulty of Paying Living Expenses: Not on file  Food Insecurity:   . Worried About Charity fundraiser in the Last Year: Not on file  . Ran Out of Food in the Last Year: Not on file  Transportation Needs:   . Lack of Transportation (Medical): Not on file  . Lack of Transportation (Non-Medical): Not on file  Physical Activity:   . Days of Exercise per Week: Not on file  . Minutes of Exercise per Session: Not on file  Stress:   . Feeling of Stress : Not on file  Social Connections:   . Frequency of Communication with Friends and Family: Not on file  . Frequency of Social Gatherings with Friends and Family: Not on file  . Attends Religious Services: Not on file  . Active Member of Clubs or Organizations: Not on file  . Attends Archivist Meetings: Not on file  . Marital Status: Not on file  Intimate Partner Violence:   . Fear of Current or Ex-Partner: Not on file  . Emotionally Abused: Not on file  . Physically Abused: Not on file  . Sexually Abused: Not on file    Outpatient Encounter Medications as of 01/21/2019  Medication  Sig  . naproxen (NAPROSYN) 375 MG tablet Take 1 tablet (375 mg total) by mouth 2 (two) times daily.  . cyclobenzaprine (FLEXERIL) 5 MG tablet Take 1 tablet (5 mg total) by mouth 3 (three) times daily as needed for up to 10 days for muscle spasms.  . predniSONE (DELTASONE) 20 MG tablet 2 po at sametime daily for 5 days   No facility-administered encounter medications on file as of 01/21/2019.    Allergies  Allergen Reactions  . Codeine   . E-Mycin [Erythromycin]     Review of Systems  Constitutional: Negative for activity change, appetite change, chills, diaphoresis, fatigue, fever, unexpected weight change and weight loss.  HENT: Negative.   Eyes: Negative.   Negative for photophobia and visual disturbance.  Respiratory: Negative for cough, chest tightness and shortness of breath.   Cardiovascular: Negative for chest pain, palpitations and leg swelling.  Gastrointestinal: Negative for abdominal pain, blood in stool, bowel incontinence, constipation, diarrhea, nausea and vomiting.  Endocrine: Negative.   Genitourinary: Negative for bladder incontinence, decreased urine volume, difficulty urinating, dysuria, frequency, pelvic pain and urgency.  Musculoskeletal: Positive for back pain and myalgias. Negative for arthralgias, joint swelling, neck pain and neck stiffness.  Skin: Negative.   Allergic/Immunologic: Negative.   Neurological: Negative for dizziness, tingling, tremors, seizures, syncope, facial asymmetry, speech difficulty, weakness, light-headedness, numbness, headaches and paresthesias.  Hematological: Negative.   Psychiatric/Behavioral: Negative for confusion, hallucinations, sleep disturbance and suicidal ideas.  All other systems reviewed and are negative.       Objective:  BP 96/60   Pulse 69   Temp 98.4 F (36.9 C)   Resp 20   Ht _0  (1.651 m)   Wt 187 lb (84.8 kg)   LMP 12/31/2018   SpO2 97%   BMI 31.12 kg/m    Wt Readings from Last 3 Encounters:  01/21/19 187 lb (84.8 kg)  11/13/18 185 lb (83.9 kg)  11/23/17 178 lb (80.7 kg)    Physical Exam Vitals and nursing note reviewed.  Constitutional:      General: She is not in acute distress.    Appearance: Normal appearance. She is well-developed and well-groomed. She is not ill-appearing, toxic-appearing or diaphoretic.  HENT:     Head: Normocephalic and atraumatic.     Jaw: There is normal jaw occlusion.     Right Ear: Hearing normal.     Left Ear: Hearing normal.     Nose: Nose normal.     Mouth/Throat:     Lips: Pink.     Mouth: Mucous membranes are moist.     Pharynx: Oropharynx is clear. Uvula midline.  Eyes:     General: Lids are normal.     Extraocular  Movements: Extraocular movements intact.     Conjunctiva/sclera: Conjunctivae normal.     Pupils: Pupils are equal, round, and reactive to light.  Neck:     Thyroid: No thyroid mass, thyromegaly or thyroid tenderness.     Vascular: No carotid bruit or JVD.     Trachea: Trachea and phonation normal.  Cardiovascular:     Rate and Rhythm: Normal rate and regular rhythm.     Chest Wall: PMI is not displaced.     Pulses: Normal pulses.     Heart sounds: Normal heart sounds. No murmur. No friction rub. No gallop.   Pulmonary:     Effort: Pulmonary effort is normal. No respiratory distress.     Breath sounds: Normal breath sounds. No wheezing.  Abdominal:  General: Bowel sounds are normal. There is no distension or abdominal bruit.     Palpations: Abdomen is soft. There is no hepatomegaly or splenomegaly.     Tenderness: There is no abdominal tenderness. There is no right CVA tenderness or left CVA tenderness.     Hernia: No hernia is present.  Musculoskeletal:        General: Normal range of motion.     Cervical back: Normal, normal range of motion and neck supple.     Thoracic back: Normal.     Lumbar back: Spasms and tenderness present. No swelling, edema, deformity, signs of trauma, lacerations or bony tenderness. Normal range of motion. Negative right straight leg raise test and negative left straight leg raise test. No scoliosis.       Back:     Right lower leg: No edema.     Left lower leg: No edema.     Comments: Pain with lateral rotation, flexion, and extension  Lymphadenopathy:     Cervical: No cervical adenopathy.  Skin:    General: Skin is warm and dry.     Capillary Refill: Capillary refill takes less than 2 seconds.     Coloration: Skin is not cyanotic, jaundiced or pale.     Findings: No rash.  Neurological:     General: No focal deficit present.     Mental Status: She is alert and oriented to person, place, and time.     Cranial Nerves: Cranial nerves are intact.  No cranial nerve deficit.     Sensory: Sensation is intact. No sensory deficit.     Motor: Motor function is intact. No weakness.     Coordination: Coordination is intact. Coordination normal.     Gait: Gait is intact. Gait normal.     Deep Tendon Reflexes: Reflexes are normal and symmetric. Reflexes normal.  Psychiatric:        Attention and Perception: Attention and perception normal.        Mood and Affect: Mood and affect normal.        Speech: Speech normal.        Behavior: Behavior normal. Behavior is cooperative.        Thought Content: Thought content normal.        Cognition and Memory: Cognition and memory normal.        Judgment: Judgment normal.     Results for orders placed or performed in visit on 11/13/18  Microscopic Examination   URINE  Result Value Ref Range   WBC, UA 6-10 (A) 0 - 5 /hpf   RBC 3-10 (A) 0 - 2 /hpf   Epithelial Cells (non renal) >10 (A) 0 - 10 /hpf   Renal Epithel, UA None seen None seen /hpf   Bacteria, UA Many (A) None seen/Few  CMP14+EGFR  Result Value Ref Range   Glucose 91 65 - 99 mg/dL   BUN 8 6 - 24 mg/dL   Creatinine, Ser 0.63 0.57 - 1.00 mg/dL   GFR calc non Af Amer 112 >59 mL/min/1.73   GFR calc Af Amer 129 >59 mL/min/1.73   BUN/Creatinine Ratio 13 9 - 23   Sodium 139 134 - 144 mmol/L   Potassium 4.4 3.5 - 5.2 mmol/L   Chloride 105 96 - 106 mmol/L   CO2 20 20 - 29 mmol/L   Calcium 8.9 8.7 - 10.2 mg/dL   Total Protein 7.1 6.0 - 8.5 g/dL   Albumin 4.0 3.8 - 4.8 g/dL   Globulin,  Total 3.1 1.5 - 4.5 g/dL   Albumin/Globulin Ratio 1.3 1.2 - 2.2   Bilirubin Total 0.4 0.0 - 1.2 mg/dL   Alkaline Phosphatase 98 39 - 117 IU/L   AST 22 0 - 40 IU/L   ALT 21 0 - 32 IU/L  Lipid panel  Result Value Ref Range   Cholesterol, Total 158 100 - 199 mg/dL   Triglycerides 94 0 - 149 mg/dL   HDL 43 >39 mg/dL   VLDL Cholesterol Cal 18 5 - 40 mg/dL   LDL Chol Calc (NIH) 97 0 - 99 mg/dL   Chol/HDL Ratio 3.7 0.0 - 4.4 ratio  Thyroid Panel With TSH   Result Value Ref Range   TSH 1.260 0.450 - 4.500 uIU/mL   T4, Total 7.9 4.5 - 12.0 ug/dL   T3 Uptake Ratio 28 24 - 39 %   Free Thyroxine Index 2.2 1.2 - 4.9  CBC with Differential/Platelet  Result Value Ref Range   WBC 6.8 3.4 - 10.8 x10E3/uL   RBC 4.80 3.77 - 5.28 x10E6/uL   Hemoglobin 13.3 11.1 - 15.9 g/dL   Hematocrit 39.4 34.0 - 46.6 %   MCV 82 79 - 97 fL   MCH 27.7 26.6 - 33.0 pg   MCHC 33.8 31.5 - 35.7 g/dL   RDW 12.7 11.7 - 15.4 %   Platelets 185 150 - 450 x10E3/uL   Neutrophils 59 Not Estab. %   Lymphs 33 Not Estab. %   Monocytes 6 Not Estab. %   Eos 1 Not Estab. %   Basos 1 Not Estab. %   Neutrophils Absolute 4.1 1.4 - 7.0 x10E3/uL   Lymphocytes Absolute 2.3 0.7 - 3.1 x10E3/uL   Monocytes Absolute 0.4 0.1 - 0.9 x10E3/uL   EOS (ABSOLUTE) 0.0 0.0 - 0.4 x10E3/uL   Basophils Absolute 0.0 0.0 - 0.2 x10E3/uL   Immature Granulocytes 0 Not Estab. %   Immature Grans (Abs) 0.0 0.0 - 0.1 x10E3/uL  Urinalysis, Complete  Result Value Ref Range   Specific Gravity, UA 1.025 1.005 - 1.030   pH, UA 6.0 5.0 - 7.5   Color, UA Yellow Yellow   Appearance Ur Clear Clear   Leukocytes,UA Negative Negative   Protein,UA Negative Negative/Trace   Glucose, UA Negative Negative   Ketones, UA Negative Negative   RBC, UA Trace (A) Negative   Bilirubin, UA Negative Negative   Urobilinogen, Ur 0.2 0.2 - 1.0 mg/dL   Nitrite, UA Negative Negative   Microscopic Examination See below:   IGP, Aptima HPV, rfx 16/18,45  Result Value Ref Range   Interpretation NILM    Category NIL    Adequacy ENDO    Clinician Provided ICD10 Comment    Performed by: Comment    Note: Comment    Test Methodology Comment    HPV Aptima Negative Negative       Pertinent labs & imaging results that were available during my care of the patient were reviewed by me and considered in my medical decision making.  Assessment & Plan:  Rowyn was seen today for back pain.  Diagnoses and all orders for this  visit:  Acute bilateral low back pain without sciatica Spasm of muscle of lower back Bilateral lower back pain without sciatica. No red flags concerning for Cauda Equina Syndrome. No red flags concerning for cancer. Spasm noted in bilateral lower Paraspinous muscles. Symptomatic care discussed in detail. Will burst with prednisone for antiinflammatory and flexeril as needed for muscle spasms. Symptomatic care discussed in  detail. Report any new, worsening, or persistent symptoms. Medications as prescribed. Follow up in 4-6 weeks if not improving.  -     predniSONE (DELTASONE) 20 MG tablet; 2 po at sametime daily for 5 days -     cyclobenzaprine (FLEXERIL) 5 MG tablet; Take 1 tablet (5 mg total) by mouth 3 (three) times daily as needed for up to 10 days for muscle spasms.     Continue all other maintenance medications.  Follow up plan: Return in about 4 weeks (around 02/18/2019), or if symptoms worsen or fail to improve.  Continue healthy lifestyle choices, including diet (rich in fruits, vegetables, and lean proteins, and low in salt and simple carbohydrates) and exercise (at least 30 minutes of moderate physical activity daily).  Educational handout given for back pain  The above assessment and management plan was discussed with the patient. The patient verbalized understanding of and has agreed to the management plan. Patient is aware to call the clinic if they develop any new symptoms or if symptoms persist or worsen. Patient is aware when to return to the clinic for a follow-up visit. Patient educated on when it is appropriate to go to the emergency department.   Monia Pouch, FNP-C [facility] [phone]

## 2019-02-14 ENCOUNTER — Other Ambulatory Visit: Payer: Self-pay

## 2019-02-15 ENCOUNTER — Ambulatory Visit: Payer: BC Managed Care – PPO | Admitting: Nurse Practitioner

## 2019-02-15 ENCOUNTER — Encounter: Payer: Self-pay | Admitting: Nurse Practitioner

## 2019-02-15 VITALS — BP 111/72 | HR 69 | Temp 97.7°F | Resp 20 | Ht 65.0 in | Wt 187.0 lb

## 2019-02-15 DIAGNOSIS — M545 Low back pain, unspecified: Secondary | ICD-10-CM

## 2019-02-15 MED ORDER — NAPROXEN 500 MG PO TABS: 500.0000 mg | ORAL_TABLET | Freq: Two times a day (BID) | ORAL | 1 refills | Status: DC

## 2019-02-15 MED ORDER — KETOROLAC TROMETHAMINE 60 MG/2ML IM SOLN
60.0000 mg | Freq: Once | INTRAMUSCULAR | Status: AC
  Administered 2019-02-15: 60 mg via INTRAMUSCULAR

## 2019-02-15 NOTE — Progress Notes (Signed)
   Subjective:    Patient ID: Dawn Caldwell, female    DOB: August 30, 1975, 44 y.o.   MRN: [id number]   Chief Complaint: Back Pain   HPI Patient comes in today c/o back pain. She saw M. Rakes ,NP on 01/21/19 and was given prednisone and flexeril. When done with prednisone she was told to go back on naprosyn low dose that she already had. The naprosyn has not been helping but dose is 250. She took one of hr daughters 500mg  naprosyn last night and it worked better. She would like to increase naprosyn dose. Pain is left lower back. Rates pain 2/10 currently. Some days it goes p to 8-10. Pain does not radiate. Sitting increases pain. Standing and walking decrease pain   Review of Systems  Constitutional: Negative for diaphoresis.  Eyes: Negative for pain.  Respiratory: Negative for shortness of breath.   Cardiovascular: Negative for chest pain, palpitations and leg swelling.  Gastrointestinal: Negative for abdominal pain.  Endocrine: Negative for polydipsia.  Musculoskeletal: Positive for back pain.  Skin: Negative for rash.  Neurological: Negative for dizziness, weakness and headaches.  Hematological: Does not bruise/bleed easily.  All other systems reviewed and are negative.      Objective:   Physical Exam Vitals and nursing note reviewed.  Constitutional:      Appearance: Normal appearance.  Cardiovascular:     Rate and Rhythm: Normal rate and regular rhythm.  Pulmonary:     Breath sounds: Normal breath sounds.  Musculoskeletal:     Comments: FROM of lumbar spne with pain on flexion and extension. (-) SLR bil Motor strength and sensation distally intact  Neurological:     Mental Status: She is alert.    BP 111/72   Pulse 69   Temp 97.7 F (36.5 C) (Temporal)   Resp 20   Ht 5\' 5"  (1.651 m)   Wt 187 lb (84.8 kg)   SpO2 98%   BMI 31.12 kg/m         Assessment & Plan:  Dawn Caldwell in today with chief complaint of Back Pain   1. Acute left-sided low back pain  without sciatica Moist heat Rest No bending or stooping Get upa and walk a litt;e every 30 minutes at work salpona patches OTC  Meds ordered this encounter  Medications  . ketorolac (TORADOL) injection 60 mg  . naproxen (NAPROSYN) 500 MG tablet    Sig: Take 1 tablet (500 mg total) by mouth 2 (two) times daily with a meal.    Dispense:  60 tablet    Refill:  1    Order Specific Question:   Supervising Provider    Answer:   4/10    Mary-Margaret Nils Pyle, FNP

## 2019-02-15 NOTE — Patient Instructions (Signed)
Acute Back Pain, Adult Acute back pain is sudden and usually short-lived. It is often caused by an injury to the muscles and tissues in the back. The injury may result from:  A muscle or ligament getting overstretched or torn (strained). Ligaments are tissues that connect bones to each other. Lifting something improperly can cause a back strain.  Wear and tear (degeneration) of the spinal disks. Spinal disks are circular tissue that provides cushioning between the bones of the spine (vertebrae).  Twisting motions, such as while playing sports or doing yard work.  A hit to the back.  Arthritis. You may have a physical exam, lab tests, and imaging tests to find the cause of your pain. Acute back pain usually goes away with rest and home care. Follow these instructions at home: Managing pain, stiffness, and swelling  Take over-the-counter and prescription medicines only as told by your health care provider.  Your health care provider may recommend applying ice during the first 24-48 hours after your pain starts. To do this: ? Put ice in a plastic bag. ? Place a towel between your skin and the bag. ? Leave the ice on for 20 minutes, 2-3 times a day.  If directed, apply heat to the affected area as often as told by your health care provider. Use the heat source that your health care provider recommends, such as a moist heat pack or a heating pad. ? Place a towel between your skin and the heat source. ? Leave the heat on for 20-30 minutes. ? Remove the heat if your skin turns bright red. This is especially important if you are unable to feel pain, heat, or cold. You have a greater risk of getting burned. Activity   Do not stay in bed. Staying in bed for more than 1-2 days can delay your recovery.  Sit up and stand up straight. Avoid leaning forward when you sit, or hunching over when you stand. ? If you work at a desk, sit close to it so you do not need to lean over. Keep your chin tucked  in. Keep your neck drawn back, and keep your elbows bent at a right angle. Your arms should look like the letter "L." ? Sit high and close to the steering wheel when you drive. Add lower back (lumbar) support to your car seat, if needed.  Take short walks on even surfaces as soon as you are able. Try to increase the length of time you walk each day.  Do not sit, drive, or stand in one place for more than 30 minutes at a time. Sitting or standing for long periods of time can put stress on your back.  Do not drive or use heavy machinery while taking prescription pain medicine.  Use proper lifting techniques. When you bend and lift, use positions that put less stress on your back: ? Bend your knees. ? Keep the load close to your body. ? Avoid twisting.  Exercise regularly as told by your health care provider. Exercising helps your back heal faster and helps prevent back injuries by keeping muscles strong and flexible.  Work with a physical therapist to make a safe exercise program, as recommended by your health care provider. Do any exercises as told by your physical therapist. Lifestyle  Maintain a healthy weight. Extra weight puts stress on your back and makes it difficult to have good posture.  Avoid activities or situations that make you feel anxious or stressed. Stress and anxiety increase muscle   tension and can make back pain worse. Learn ways to manage anxiety and stress, such as through exercise. General instructions  Sleep on a firm mattress in a comfortable position. Try lying on your side with your knees slightly bent. If you lie on your back, put a pillow under your knees.  Follow your treatment plan as told by your health care provider. This may include: ? Cognitive or behavioral therapy. ? Acupuncture or massage therapy. ? Meditation or yoga. Contact a health care provider if:  You have pain that is not relieved with rest or medicine.  You have increasing pain going down  into your legs or buttocks.  Your pain does not improve after 2 weeks.  You have pain at night.  You lose weight without trying.  You have a fever or chills. Get help right away if:  You develop new bowel or bladder control problems.  You have unusual weakness or numbness in your arms or legs.  You develop nausea or vomiting.  You develop abdominal pain.  You feel faint. Summary  Acute back pain is sudden and usually short-lived.  Use proper lifting techniques. When you bend and lift, use positions that put less stress on your back.  Take over-the-counter and prescription medicines and apply heat or ice as directed by your health care provider. This information is not intended to replace advice given to you by your health care provider. Make sure you discuss any questions you have with your health care provider. Document Revised: 05/15/2018 Document Reviewed: 09/07/2016 [employer] Patient Education  2020 [employer]  

## 2019-10-23 DIAGNOSIS — Z1231 Encounter for screening mammogram for malignant neoplasm of breast: Secondary | ICD-10-CM | POA: Diagnosis not present

## 2019-11-22 DIAGNOSIS — Z20822 Contact with and (suspected) exposure to covid-19: Secondary | ICD-10-CM | POA: Diagnosis not present

## 2019-11-25 ENCOUNTER — Other Ambulatory Visit (HOSPITAL_COMMUNITY)
Admission: RE | Admit: 2019-11-25 | Discharge: 2019-11-25 | Disposition: A | Discharge status: Home / Self Care | Payer: BC Managed Care – PPO | Source: Ambulatory Visit | Attending: Nurse Practitioner | Admitting: Nurse Practitioner

## 2019-11-25 ENCOUNTER — Other Ambulatory Visit: Payer: Self-pay

## 2019-11-25 ENCOUNTER — Encounter: Payer: Self-pay | Admitting: Nurse Practitioner

## 2019-11-25 ENCOUNTER — Ambulatory Visit (INDEPENDENT_AMBULATORY_CARE_PROVIDER_SITE_OTHER): Payer: BC Managed Care – PPO | Admitting: Nurse Practitioner

## 2019-11-25 VITALS — BP 108/80 | HR 73 | Temp 97.9°F | Resp 20 | Ht 65.0 in | Wt 185.0 lb

## 2019-11-25 DIAGNOSIS — Z01419 Encounter for gynecological examination (general) (routine) without abnormal findings: Secondary | ICD-10-CM | POA: Insufficient documentation

## 2019-11-25 DIAGNOSIS — N946 Dysmenorrhea, unspecified: Secondary | ICD-10-CM

## 2019-11-25 DIAGNOSIS — Z23 Encounter for immunization: Secondary | ICD-10-CM

## 2019-11-25 DIAGNOSIS — Z6828 Body mass index (BMI) 28.0-28.9, adult: Secondary | ICD-10-CM | POA: Diagnosis not present

## 2019-11-25 DIAGNOSIS — Z1159 Encounter for screening for other viral diseases: Secondary | ICD-10-CM | POA: Diagnosis not present

## 2019-11-25 DIAGNOSIS — Z0000 Encounter for general adult medical examination without abnormal findings: Secondary | ICD-10-CM

## 2019-11-25 LAB — URINALYSIS, COMPLETE
Bilirubin, UA: NEGATIVE
Glucose, UA: NEGATIVE
Ketones, UA: NEGATIVE
Leukocytes,UA: NEGATIVE
Nitrite, UA: POSITIVE — AB
Protein,UA: NEGATIVE
Specific Gravity, UA: 1.025 (ref 1.005–1.030)
Urobilinogen, Ur: 0.2 mg/dL (ref 0.2–1.0)
pH, UA: 5.5 (ref 5.0–7.5)

## 2019-11-25 LAB — MICROSCOPIC EXAMINATION: RBC, Urine: NONE SEEN /hpf (ref 0–2)

## 2019-11-25 LAB — LIPID PANEL

## 2019-11-25 MED ORDER — NAPROXEN 500 MG PO TABS: 500.0000 mg | ORAL_TABLET | Freq: Two times a day (BID) | ORAL | 1 refills | Status: DC

## 2019-11-25 NOTE — Patient Instructions (Signed)
Exercising to Stay Healthy To become healthy and stay healthy, it is recommended that you do moderate-intensity and vigorous-intensity exercise. You can tell that you are exercising at a moderate intensity if your heart starts beating faster and you start breathing faster but can still hold a conversation. You can tell that you are exercising at a vigorous intensity if you are breathing much harder and faster and cannot hold a conversation while exercising. Exercising regularly is important. It has many health benefits, such as:  Improving overall fitness, flexibility, and endurance.  Increasing bone density.  Helping with weight control.  Decreasing body fat.  Increasing muscle strength.  Reducing stress and tension.  Improving overall health. How often should I exercise? Choose an activity that you enjoy, and set realistic goals. Your health care provider can help you make an activity plan that works for you. Exercise regularly as told by your health care provider. This may include:  Doing strength training two times a week, such as: ? Lifting weights. ? Using resistance bands. ? Push-ups. ? Sit-ups. ? Yoga.  Doing a certain intensity of exercise for a given amount of time. Choose from these options: ? A total of 150 minutes of moderate-intensity exercise every week. ? A total of 75 minutes of vigorous-intensity exercise every week. ? A mix of moderate-intensity and vigorous-intensity exercise every week. Children, pregnant women, people who have not exercised regularly, people who are overweight, and older adults may need to talk with a health care provider about what activities are safe to do. If you have a medical condition, be sure to talk with your health care provider before you start a new exercise program. What are some exercise ideas? Moderate-intensity exercise ideas include:  Walking 1 mile (1.6 km) in about 15  minutes.  Biking.  Hiking.  Golfing.  Dancing.  Water aerobics. Vigorous-intensity exercise ideas include:  Walking 4.5 miles (7.2 km) or more in about 1 hour.  Jogging or running 5 miles (8 km) in about 1 hour.  Biking 10 miles (16.1 km) or more in about 1 hour.  Lap swimming.  Roller-skating or in-line skating.  Cross-country skiing.  Vigorous competitive sports, such as football, basketball, and soccer.  Jumping rope.  Aerobic dancing. What are some everyday activities that can help me to get exercise?  Yard work, such as: ? Pushing a lawn mower. ? Raking and bagging leaves.  Washing your car.  Pushing a stroller.  Shoveling snow.  Gardening.  Washing windows or floors. How can I be more active in my day-to-day activities?  Use stairs instead of an elevator.  Take a walk during your lunch break.  If you drive, park your car farther away from your work or school.  If you take public transportation, get off one stop early and walk the rest of the way.  Stand up or walk around during all of your indoor phone calls.  Get up, stretch, and walk around every 30 minutes throughout the day.  Enjoy exercise with a friend. Support to continue exercising will help you keep a regular routine of activity. What guidelines can I follow while exercising?  Before you start a new exercise program, talk with your health care provider.  Do not exercise so much that you hurt yourself, feel dizzy, or get very short of breath.  Wear comfortable clothes and wear shoes with good support.  Drink plenty of water while you exercise to prevent dehydration or heat stroke.  Work out until your breathing   and your heartbeat get faster. Where to find more information  U.S. Department of Health and Human Services: www.hhs.gov  Centers for Disease Control and Prevention (CDC): www.cdc.gov Summary  Exercising regularly is important. It will improve your overall fitness,  flexibility, and endurance.  Regular exercise also will improve your overall health. It can help you control your weight, reduce stress, and improve your bone density.  Do not exercise so much that you hurt yourself, feel dizzy, or get very short of breath.  Before you start a new exercise program, talk with your health care provider. This information is not intended to replace advice given to you by your health care provider. Make sure you discuss any questions you have with your health care provider. Document Revised: 01/06/2017 Document Reviewed: 12/15/2016 Elsevier Patient Education  2020 Elsevier Inc.  

## 2019-11-25 NOTE — Progress Notes (Signed)
Subjective:    Patient ID: Dawn Caldwell, female    DOB: 06/09/1975, 44 y.o.   MRN: [id number]   Chief Complaint: Annual Exam    HPI:  1. Annual physical exam will get pap today. Last PAP was normal  2. BMI 28.0-28.9,adult No recent weight changes Wt Readings from Last 3 Encounters:  11/25/19 185 lb (83.9 kg)  02/15/19 187 lb (84.8 kg)  01/21/19 187 lb (84.8 kg)   BMI Readings from Last 3 Encounters:  11/25/19 30.79 kg/m  02/15/19 31.12 kg/m  01/21/19 31.12 kg/m     3. Menstrual cramps Has menstrual cramps monthly- and takes naprosyn which helps.    Outpatient Encounter Medications as of 11/25/2019  Medication Sig   naproxen (NAPROSYN) 500 MG tablet Take 1 tablet (500 mg total) by mouth 2 (two) times daily with a meal.   [DISCONTINUED] naproxen (NAPROSYN) 375 MG tablet Take 1 tablet (375 mg total) by mouth 2 (two) times daily.   No facility-administered encounter medications on file as of 11/25/2019.    Past Surgical History:  Procedure Laterality Date   GALLBLADDER SURGERY  2019    Family History  Problem Relation Age of Onset   Diabetes Mother    Hypertension Mother    Diabetes Father    Seizures Brother     New complaints: none today  Social history: Lives with husband son and daughter. 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  Controlled substance contract: n/a    Review of Systems  Constitutional: Negative for diaphoresis.  Eyes: Negative for pain.  Respiratory: Negative for shortness of breath.   Cardiovascular: Negative for chest pain, palpitations and leg swelling.  Gastrointestinal: Negative for abdominal pain.  Endocrine: Negative for polydipsia.  Skin: Negative for rash.  Neurological: Negative for dizziness, weakness and headaches.  Hematological: Does not bruise/bleed easily.  All other systems reviewed and are negative.      Objective:   Physical Exam Vitals and nursing note reviewed.    Constitutional:      General: She is not in acute distress.    Appearance: Normal appearance. She is well-developed.  HENT:     Head: Normocephalic.     Nose: Nose normal.  Eyes:     Pupils: Pupils are equal, round, and reactive to light.  Neck:     Vascular: No carotid bruit or JVD.  Cardiovascular:     Rate and Rhythm: Normal rate and regular rhythm.     Heart sounds: Normal heart sounds.  Pulmonary:     Effort: Pulmonary effort is normal. No respiratory distress.     Breath sounds: Normal breath sounds. No wheezing or rales.  Chest:     Chest wall: No tenderness.  Abdominal:     General: Bowel sounds are normal. There is no distension or abdominal bruit.     Palpations: Abdomen is soft. There is no hepatomegaly, splenomegaly, mass or pulsatile mass.     Tenderness: There is no abdominal tenderness.  Musculoskeletal:        General: Normal range of motion.     Cervical back: Normal range of motion and neck supple.  Lymphadenopathy:     Cervical: No cervical adenopathy.  Skin:    General: Skin is warm and dry.  Neurological:     Mental Status: She is alert and oriented to person, place, and time.     Deep Tendon Reflexes: Reflexes are normal and symmetric.  Psychiatric:  Behavior: Behavior normal.        Thought Content: Thought content normal.        Judgment: Judgment normal.    BP 108/80    Pulse 73    Temp 97.9 F (36.6 C) (Temporal)    Resp 20    Ht '5\' 5"'  (1.651 m)    Wt 185 lb (83.9 kg)    SpO2 97%    BMI 30.79 kg/m         Assessment & Plan:  .Dawn Caldwell comes in today with chief complaint of Annual Exam   Diagnosis and orders addressed:  1. Annual physical exam - Urinalysis, Complete - Cytology - PAP - CBC with Differential/Platelet - CMP14+EGFR - Lipid panel - Thyroid Panel With TSH  2. BMI 28.0-28.9,adult Discussed diet and exercise for person with BMI >25 Will recheck weight in 3-6 months  3. Menstrual cramps Moist heat to  abdomen Start taking naprosyn the day prior to menses starting - naproxen (NAPROSYN) 500 MG tablet; Take 1 tablet (500 mg total) by mouth 2 (two) times daily with a meal.  Dispense: 60 tablet; Refill: 1   Labs pending Health Maintenance reviewed Diet and exercise encouraged  Follow up plan: prn   Dawn Caldwell Done, FNP

## 2019-11-26 LAB — LIPID PANEL
Chol/HDL Ratio: 3.9 ratio (ref 0.0–4.4)
Cholesterol, Total: 165 mg/dL (ref 100–199)
HDL: 42 mg/dL (ref >39)
LDL Chol Calc (NIH): 107 mg/dL — ABNORMAL HIGH (ref 0–99)
Triglycerides: 83 mg/dL (ref 0–149)
VLDL Cholesterol Cal: 16 mg/dL (ref 5–40)

## 2019-11-26 LAB — CMP14+EGFR
ALT: 35 IU/L — ABNORMAL HIGH (ref 0–32)
AST: 22 IU/L (ref 0–40)
Albumin/Globulin Ratio: 1.4 (ref 1.2–2.2)
Albumin: 4.3 g/dL (ref 3.8–4.8)
Alkaline Phosphatase: 106 IU/L (ref 44–121)
BUN/Creatinine Ratio: 14 (ref 9–23)
BUN: 11 mg/dL (ref 6–24)
Bilirubin Total: 0.3 mg/dL (ref 0.0–1.2)
CO2: 19 mmol/L — ABNORMAL LOW (ref 20–29)
Calcium: 9.5 mg/dL (ref 8.7–10.2)
Chloride: 103 mmol/L (ref 96–106)
Creatinine, Ser: 0.77 mg/dL (ref 0.57–1.00)
GFR calc Af Amer: 110 mL/min/{1.73_m2} (ref >59)
GFR calc non Af Amer: 96 mL/min/{1.73_m2} (ref >59)
Globulin, Total: 3.1 g/dL (ref 1.5–4.5)
Glucose: 111 mg/dL — ABNORMAL HIGH (ref 65–99)
Potassium: 5 mmol/L (ref 3.5–5.2)
Sodium: 138 mmol/L (ref 134–144)
Total Protein: 7.4 g/dL (ref 6.0–8.5)

## 2019-11-26 LAB — CBC WITH DIFFERENTIAL/PLATELET
Basophils Absolute: 0 10*3/uL (ref 0.0–0.2)
Basos: 0 %
EOS (ABSOLUTE): 0.1 10*3/uL (ref 0.0–0.4)
Eos: 1 %
Hematocrit: 42.6 % (ref 34.0–46.6)
Hemoglobin: 13.6 g/dL (ref 11.1–15.9)
Immature Grans (Abs): 0 10*3/uL (ref 0.0–0.1)
Immature Granulocytes: 0 %
Lymphocytes Absolute: 2.3 10*3/uL (ref 0.7–3.1)
Lymphs: 32 %
MCH: 26.9 pg (ref 26.6–33.0)
MCHC: 31.9 g/dL (ref 31.5–35.7)
MCV: 84 fL (ref 79–97)
Monocytes Absolute: 0.6 10*3/uL (ref 0.1–0.9)
Monocytes: 8 %
Neutrophils Absolute: 4.3 10*3/uL (ref 1.4–7.0)
Neutrophils: 59 %
Platelets: 196 10*3/uL (ref 150–450)
RBC: 5.06 x10E6/uL (ref 3.77–5.28)
RDW: 12.4 % (ref 11.7–15.4)
WBC: 7.2 10*3/uL (ref 3.4–10.8)

## 2019-11-26 LAB — THYROID PANEL WITH TSH
Free Thyroxine Index: 2.6 (ref 1.2–4.9)
T3 Uptake Ratio: 30 % (ref 24–39)
T4, Total: 8.8 ug/dL (ref 4.5–12.0)
TSH: 1.51 u[IU]/mL (ref 0.450–4.500)

## 2019-11-26 LAB — HEPATITIS C ANTIBODY: Hep C Virus Ab: <0.1 s/co ratio (ref 0.0–0.9)

## 2019-11-27 LAB — CYTOLOGY - PAP
Chlamydia: NEGATIVE
Comment: NEGATIVE
Comment: NEGATIVE
Comment: NORMAL
Diagnosis: NEGATIVE
Diagnosis: REACTIVE
Neisseria Gonorrhea: NEGATIVE
Trichomonas: NEGATIVE

## 2019-11-28 DIAGNOSIS — Z20822 Contact with and (suspected) exposure to covid-19: Secondary | ICD-10-CM | POA: Diagnosis not present

## 2019-12-05 DIAGNOSIS — Z20822 Contact with and (suspected) exposure to covid-19: Secondary | ICD-10-CM | POA: Diagnosis not present

## 2019-12-13 DIAGNOSIS — Z20822 Contact with and (suspected) exposure to covid-19: Secondary | ICD-10-CM | POA: Diagnosis not present

## 2019-12-16 DIAGNOSIS — J329 Chronic sinusitis, unspecified: Secondary | ICD-10-CM | POA: Diagnosis not present

## 2019-12-16 DIAGNOSIS — R509 Fever, unspecified: Secondary | ICD-10-CM | POA: Diagnosis not present

## 2019-12-18 DIAGNOSIS — U071 COVID-19: Secondary | ICD-10-CM | POA: Diagnosis not present

## 2019-12-18 DIAGNOSIS — Z23 Encounter for immunization: Secondary | ICD-10-CM | POA: Diagnosis not present

## 2019-12-28 DIAGNOSIS — Z20822 Contact with and (suspected) exposure to covid-19: Secondary | ICD-10-CM | POA: Diagnosis not present

## 2020-01-03 DIAGNOSIS — Z20822 Contact with and (suspected) exposure to covid-19: Secondary | ICD-10-CM | POA: Diagnosis not present

## 2020-01-10 DIAGNOSIS — Z20822 Contact with and (suspected) exposure to covid-19: Secondary | ICD-10-CM | POA: Diagnosis not present

## 2020-01-16 DIAGNOSIS — Z20822 Contact with and (suspected) exposure to covid-19: Secondary | ICD-10-CM | POA: Diagnosis not present

## 2020-01-16 DIAGNOSIS — R509 Fever, unspecified: Secondary | ICD-10-CM | POA: Diagnosis not present

## 2020-01-17 ENCOUNTER — Telehealth: Payer: Self-pay

## 2020-01-17 DIAGNOSIS — R52 Pain, unspecified: Secondary | ICD-10-CM | POA: Diagnosis not present

## 2020-01-17 DIAGNOSIS — N3001 Acute cystitis with hematuria: Secondary | ICD-10-CM | POA: Diagnosis not present

## 2020-01-17 DIAGNOSIS — R509 Fever, unspecified: Secondary | ICD-10-CM | POA: Diagnosis not present

## 2020-01-17 DIAGNOSIS — R5383 Other fatigue: Secondary | ICD-10-CM | POA: Diagnosis not present

## 2020-01-17 NOTE — Telephone Encounter (Signed)
lmtcb

## 2020-01-17 NOTE — Telephone Encounter (Signed)
Attempted to contact patient - NA °

## 2020-01-20 NOTE — Telephone Encounter (Signed)
No answer, no voicemail.

## 2020-01-24 DIAGNOSIS — Z20822 Contact with and (suspected) exposure to covid-19: Secondary | ICD-10-CM | POA: Diagnosis not present

## 2020-01-30 NOTE — Telephone Encounter (Signed)
Have tried to contact pt three times. Encounter signed today.

## 2020-02-07 DIAGNOSIS — Z20822 Contact with and (suspected) exposure to covid-19: Secondary | ICD-10-CM | POA: Diagnosis not present

## 2020-02-08 DIAGNOSIS — Z6831 Body mass index (BMI) 31.0-31.9, adult: Secondary | ICD-10-CM | POA: Diagnosis not present

## 2020-02-08 DIAGNOSIS — J029 Acute pharyngitis, unspecified: Secondary | ICD-10-CM | POA: Diagnosis not present

## 2020-02-14 ENCOUNTER — Other Ambulatory Visit: Payer: BC Managed Care – PPO

## 2020-02-14 ENCOUNTER — Other Ambulatory Visit: Payer: Self-pay

## 2020-02-14 DIAGNOSIS — Z20822 Contact with and (suspected) exposure to covid-19: Secondary | ICD-10-CM | POA: Diagnosis not present

## 2020-02-18 LAB — NOVEL CORONAVIRUS, NAA: SARS-CoV-2, NAA: NOT DETECTED

## 2020-02-21 DIAGNOSIS — Z20822 Contact with and (suspected) exposure to covid-19: Secondary | ICD-10-CM | POA: Diagnosis not present

## 2020-02-28 DIAGNOSIS — Z20822 Contact with and (suspected) exposure to covid-19: Secondary | ICD-10-CM | POA: Diagnosis not present

## 2020-03-06 DIAGNOSIS — Z20822 Contact with and (suspected) exposure to covid-19: Secondary | ICD-10-CM | POA: Diagnosis not present

## 2020-03-13 DIAGNOSIS — Z20822 Contact with and (suspected) exposure to covid-19: Secondary | ICD-10-CM | POA: Diagnosis not present

## 2020-03-20 DIAGNOSIS — Z20822 Contact with and (suspected) exposure to covid-19: Secondary | ICD-10-CM | POA: Diagnosis not present

## 2020-03-27 DIAGNOSIS — Z20822 Contact with and (suspected) exposure to covid-19: Secondary | ICD-10-CM | POA: Diagnosis not present

## 2020-04-03 DIAGNOSIS — Z20822 Contact with and (suspected) exposure to covid-19: Secondary | ICD-10-CM | POA: Diagnosis not present

## 2020-04-10 DIAGNOSIS — Z20822 Contact with and (suspected) exposure to covid-19: Secondary | ICD-10-CM | POA: Diagnosis not present

## 2020-04-17 DIAGNOSIS — Z20822 Contact with and (suspected) exposure to covid-19: Secondary | ICD-10-CM | POA: Diagnosis not present

## 2020-04-24 DIAGNOSIS — Z20822 Contact with and (suspected) exposure to covid-19: Secondary | ICD-10-CM | POA: Diagnosis not present

## 2020-05-01 DIAGNOSIS — Z20822 Contact with and (suspected) exposure to covid-19: Secondary | ICD-10-CM | POA: Diagnosis not present

## 2020-07-31 DIAGNOSIS — Z6830 Body mass index (BMI) 30.0-30.9, adult: Secondary | ICD-10-CM | POA: Diagnosis not present

## 2020-07-31 DIAGNOSIS — Z23 Encounter for immunization: Secondary | ICD-10-CM | POA: Diagnosis not present

## 2020-07-31 DIAGNOSIS — W540XXA Bitten by dog, initial encounter: Secondary | ICD-10-CM | POA: Diagnosis not present

## 2020-07-31 DIAGNOSIS — R350 Frequency of micturition: Secondary | ICD-10-CM | POA: Diagnosis not present

## 2020-11-21 DIAGNOSIS — Z1231 Encounter for screening mammogram for malignant neoplasm of breast: Secondary | ICD-10-CM | POA: Diagnosis not present

## 2023-03-06 DIAGNOSIS — M21611 Bunion of right foot: Secondary | ICD-10-CM | POA: Insufficient documentation

## 2023-06-08 ENCOUNTER — Ambulatory Visit: Admitting: Nurse Practitioner

## 2023-06-08 ENCOUNTER — Encounter: Payer: Self-pay | Admitting: Nurse Practitioner

## 2023-06-08 VITALS — BP 120/80 | HR 64 | Temp 98.2°F | Ht 65.0 in | Wt 181.6 lb

## 2023-06-08 DIAGNOSIS — E569 Vitamin deficiency, unspecified: Secondary | ICD-10-CM

## 2023-06-08 DIAGNOSIS — M25552 Pain in left hip: Secondary | ICD-10-CM | POA: Insufficient documentation

## 2023-06-08 DIAGNOSIS — Z1231 Encounter for screening mammogram for malignant neoplasm of breast: Secondary | ICD-10-CM

## 2023-06-08 DIAGNOSIS — Z0001 Encounter for general adult medical examination with abnormal findings: Secondary | ICD-10-CM | POA: Diagnosis not present

## 2023-06-08 DIAGNOSIS — J309 Allergic rhinitis, unspecified: Secondary | ICD-10-CM | POA: Insufficient documentation

## 2023-06-08 DIAGNOSIS — E66811 Obesity, class 1: Secondary | ICD-10-CM | POA: Diagnosis not present

## 2023-06-08 MED ORDER — LORATADINE 10 MG PO TABS: 10.0000 mg | ORAL_TABLET | Freq: Every day | ORAL | 1 refills | Status: AC

## 2023-06-08 NOTE — Progress Notes (Signed)
 New Patient Office Visit  Subjective   Patient ID: Dawn Caldwell, female    DOB: Aug 16, 1975  Age: 48 y.o. MRN: [id number]  CC:  Chief Complaint  Patient presents with   Establish Care   Sore Throat    Sore throat mainly on left side started last Saturday. Thinks it may be a tonsil stone or allergies    Lipoma    Thinks she has a lipoma on her left hip for years would like referral to derm.     HPI Dawn Caldwell presents on 06/08/2023 for an initial visit to establish care. She reports two primary concerns: recent onset of left tonsillar pain and chronic left hip pain associated with a known lipoma. Allergic rhinitis The patient states she awoke on Saturday with left-sided tonsillar pain and difficulty swallowing. She has a history of tonsilloliths and allergic rhinitis. She had previously taken loratadine  (Claritin ) but discontinued it; she now reports nasal congestion. The tonsillar pain has since improved. She denies fever, chills, or other systemic symptoms. Hip Pain She also reports chronic left hip pain that has been progressively worsening over the years. She has previously received injections for pain management, but the relief has diminished over time. The pain is localized to the left hip and gluteal region, where she has a known lipoma. Pain is described as dull and achy, occasionally with a burning sensation, and rated as 9/10 at its worst. Pain is non-radiating. She requests a referral to orthopedics for further evaluation and management.  Vitamin D  Deficiency: Dawn Caldwell presents with a history of vitamin D  deficiency. She was previously taking a vitamin D  supplement but discontinued it several months ago. She is unsure of her current vitamin D  status and expresses interest in determining whether supplementation should be resumed. She denies recent symptoms of fatigue, muscle weakness, or bone pain. She agrees with checking her vitamin D  level to assess the need for  restarting supplementation. Obesity Current BMI of 30.22. She acknowledges a gradual weight gain over the past several years. She reports challenges with consistent physical activity due to chronic left hip pain and associated discomfort from a gluteal lipoma. The patient denies following a structured diet or exercise regimen at this time. She is interested in discussing weight management strategies and would like support with lifestyle modifications. Denies symptoms of fatigue, shortness of breath with exertion, or recent weight changes. No history of bariatric interventions or use of weight loss medications.  Flowsheet Row Office Visit from 06/08/2023 in [facility]  PHQ-9 Total Score 0          06/08/2023    9:21 AM  GAD 7 : Generalized Anxiety Score  Nervous, Anxious, on Edge 0  Control/stop worrying 0  Worry too much - different things 0  Trouble relaxing 0  Restless 0  Easily annoyed or irritable 0  Afraid - awful might happen 0  Total GAD 7 Score 0  Anxiety Difficulty Not difficult at all     Health Maintenance: Mammogram ordered, she declines Colonoscopy " I wants to hold off right, I got too much going with my family, no time to schedule it"  Outpatient Encounter Medications as of 06/08/2023  Medication Sig   loratadine  (CLARITIN ) 10 MG tablet Take 1 tablet (10 mg total) by mouth daily.   [DISCONTINUED] naproxen  (NAPROSYN ) 500 MG tablet Take 1 tablet (500 mg total) by mouth 2 (two) times daily with a meal. (Patient not taking: Reported  on 06/08/2023)   No facility-administered encounter medications on file as of 06/08/2023.    Past Medical History:  Diagnosis Date   Allergy    Asthma     Past Surgical History:  Procedure Laterality Date   GALLBLADDER SURGERY  2019    Family History  Problem Relation Age of Onset   Diabetes Mother    Hypertension Mother    Diabetes Father    Seizures Brother     Social History   Socioeconomic  History   Marital status: Married    Spouse name: Not on file   Number of children: Not on file   Years of education: Not on file   Highest education level: Not on file  Occupational History   Not on file  Tobacco Use   Smoking status: Never   Smokeless tobacco: Never  Vaping Use   Vaping status: Never Used  Substance and Sexual Activity   Alcohol use: No   Drug use: No   Sexual activity: Not on file  Other Topics Concern   Not on file  Social History Narrative   Not on file   Social Drivers of Health   Financial Resource Strain: Not on file  Food Insecurity: Unknown (02/15/2023)   Received from [facility]   Hunger Vital Sign    Worried About Running Out of Food in the Last Year: Patient declined to answer    Ran Out of Food in the Last Year: Patient declined to answer  Transportation Needs: Not on file (02/15/2023)  Physical Activity: Inactive (06/08/2023)   Exercise Vital Sign    Days of Exercise per Week: 0 days    Minutes of Exercise per Session: 0 min  Stress: Not on file  Social Connections: Not on file  Intimate Partner Violence: Not At Risk (06/08/2023)   Humiliation, Afraid, Rape, and Kick questionnaire    Fear of Current or Ex-Partner: No    Emotionally Abused: No    Physically Abused: No    Sexually Abused: No    ROS Negative unless indicated in HPI    Objective   BP 120/80   Pulse 64   Temp 98.2 F (36.8 C) (Temporal)   Ht 5\' 5"  (1.651 m)   Wt 181 lb 9.6 oz (82.4 kg)   SpO2 98%   BMI 30.22 kg/m   Physical Exam  Last CBC Lab Results  Component Value Date   WBC 7.2 11/25/2019   HGB 13.6 11/25/2019   HCT 42.6 11/25/2019   MCV 84 11/25/2019   MCH 26.9 11/25/2019   RDW 12.4 11/25/2019   PLT 196 11/25/2019   Last metabolic panel Lab Results  Component Value Date   GLUCOSE 111 (H) 11/25/2019   NA 138 11/25/2019   K 5.0 11/25/2019   CL 103 11/25/2019   CO2 19 (L) 11/25/2019   BUN 11 11/25/2019   CREATININE 0.77 11/25/2019   GFRNONAA  96 11/25/2019   CALCIUM 9.5 11/25/2019   PROT 7.4 11/25/2019   ALBUMIN 4.3 11/25/2019   LABGLOB 3.1 11/25/2019   AGRATIO 1.4 11/25/2019   BILITOT 0.3 11/25/2019   ALKPHOS 106 11/25/2019   AST 22 11/25/2019   ALT 35 (H) 11/25/2019   Last lipids Lab Results  Component Value Date   CHOL 165 11/25/2019   HDL 42 11/25/2019   LDLCALC 107 (H) 11/25/2019   TRIG 83 11/25/2019   CHOLHDL 3.9 11/25/2019    Last thyroid  functions Lab Results  Component Value Date   TSH  1.510 11/25/2019   T4TOTAL 8.8 11/25/2019   Last vitamin D  Lab Results  Component Value Date   VD25OH 16.4 (L) 06/22/2016   Last vitamin B12 and Folate No results found for: "VITAMINB12", "FOLATE"      Assessment & Plan:  Encounter for general adult medical examination with abnormal findings -     CBC with Differential/Platelet -     Comprehensive metabolic panel with GFR -     Lipid panel -     Thyroid  Profile  Vitamin deficiency -     CBC with Differential/Platelet -     Thyroid  Profile -     VITAMIN D  25 Hydroxy (Vit-D Deficiency, Fractures)  Pain of left hip -     Ambulatory referral to Orthopedics  Obesity, Class I, BMI 30.0-34.9 (see actual BMI) -     Comprehensive metabolic panel with GFR -     Lipid panel  Allergic rhinitis, unspecified seasonality, unspecified trigger  Screening mammogram for breast cancer -     3D Screening Mammogram, Left and Right  Other orders -     Loratadine ; Take 1 tablet (10 mg total) by mouth daily.  Dispense: 90 tablet; Refill: 1  Harlen is a 48 year old Caucasian female seen today to establish care, no acute distress Allergic rhinitis: Restart Claritin  10 mg daily Left hip pain: Referral to Ortho for further care Vitamin D  deficiency: check vitamin D  level determine the need to restart the supplement Obesity: Work on lifestyle modification diet and exercise Labs CBC, CMP, Lipid, TSH, Vitam D result pending Health maintenance: Mammogram ordered,  Future:  physical with Pap  Encourage healthy lifestyle choices, including diet (rich in fruits, vegetables, and lean proteins, and low in salt and simple carbohydrates) and exercise (at least 30 minutes of moderate physical activity daily).     The above assessment and management plan was discussed with the patient. The patient verbalized understanding of and has agreed to the management plan. Patient is aware to call the clinic if they develop any new symptoms or if symptoms persist or worsen. Patient is aware when to return to the clinic for a follow-up visit. Patient educated on when it is appropriate to go to the emergency department.  Return in about 6 months (around 12/09/2023) for physical with pap.   Roya Gieselman [place] Thompson, DNP [facility] [address] [place], [place] [address] [phone]  Note: This document was prepared by [employer] voice dictation technology and any errors that results from this process are unintentional.

## 2023-06-09 LAB — COMPREHENSIVE METABOLIC PANEL WITH GFR
ALT: 21 IU/L (ref 0–32)
AST: 20 IU/L (ref 0–40)
Albumin: 4.2 g/dL (ref 3.9–4.9)
Alkaline Phosphatase: 92 IU/L (ref 44–121)
BUN/Creatinine Ratio: 14 (ref 9–23)
BUN: 10 mg/dL (ref 6–24)
Bilirubin Total: 0.4 mg/dL (ref 0.0–1.2)
CO2: 21 mmol/L (ref 20–29)
Calcium: 9.5 mg/dL (ref 8.7–10.2)
Chloride: 105 mmol/L (ref 96–106)
Creatinine, Ser: 0.7 mg/dL (ref 0.57–1.00)
Globulin, Total: 3.2 g/dL (ref 1.5–4.5)
Glucose: 109 mg/dL — ABNORMAL HIGH (ref 70–99)
Potassium: 4.5 mmol/L (ref 3.5–5.2)
Sodium: 138 mmol/L (ref 134–144)
Total Protein: 7.4 g/dL (ref 6.0–8.5)
eGFR: 108 mL/min/{1.73_m2} (ref >59)

## 2023-06-09 LAB — CBC WITH DIFFERENTIAL/PLATELET
Basophils Absolute: 0 10*3/uL (ref 0.0–0.2)
Basos: 1 %
EOS (ABSOLUTE): 0 10*3/uL (ref 0.0–0.4)
Eos: 1 %
Hematocrit: 40.5 % (ref 34.0–46.6)
Hemoglobin: 13 g/dL (ref 11.1–15.9)
Immature Grans (Abs): 0 10*3/uL (ref 0.0–0.1)
Immature Granulocytes: 0 %
Lymphocytes Absolute: 2 10*3/uL (ref 0.7–3.1)
Lymphs: 26 %
MCH: 27.9 pg (ref 26.6–33.0)
MCHC: 32.1 g/dL (ref 31.5–35.7)
MCV: 87 fL (ref 79–97)
Monocytes Absolute: 0.5 10*3/uL (ref 0.1–0.9)
Monocytes: 7 %
Neutrophils Absolute: 5 10*3/uL (ref 1.4–7.0)
Neutrophils: 65 %
Platelets: 177 10*3/uL (ref 150–450)
RBC: 4.66 x10E6/uL (ref 3.77–5.28)
RDW: 12.7 % (ref 11.7–15.4)
WBC: 7.7 10*3/uL (ref 3.4–10.8)

## 2023-06-09 LAB — THYROID PANEL
Free Thyroxine Index: 2.2 (ref 1.2–4.9)
T3 Uptake Ratio: 29 % (ref 24–39)
T4, Total: 7.7 ug/dL (ref 4.5–12.0)

## 2023-06-09 LAB — LIPID PANEL
Chol/HDL Ratio: 3.6 ratio (ref 0.0–4.4)
Cholesterol, Total: 162 mg/dL (ref 100–199)
HDL: 45 mg/dL (ref >39)
LDL Chol Calc (NIH): 102 mg/dL — ABNORMAL HIGH (ref 0–99)
Triglycerides: 80 mg/dL (ref 0–149)
VLDL Cholesterol Cal: 15 mg/dL (ref 5–40)

## 2023-06-09 LAB — VITAMIN D 25 HYDROXY (VIT D DEFICIENCY, FRACTURES): Vit D, 25-Hydroxy: 25.2 ng/mL — ABNORMAL LOW (ref 30.0–100.0)

## 2023-06-15 ENCOUNTER — Other Ambulatory Visit: Payer: Self-pay | Admitting: Nurse Practitioner

## 2023-06-15 DIAGNOSIS — E569 Vitamin deficiency, unspecified: Secondary | ICD-10-CM

## 2023-06-15 MED ORDER — VITAMIN D (ERGOCALCIFEROL) 1.25 MG (50000 UNIT) PO CAPS: 50000.0000 [IU] | ORAL_CAPSULE | ORAL | 0 refills | Status: AC

## 2023-08-28 ENCOUNTER — Encounter: Payer: Self-pay | Admitting: Nurse Practitioner

## 2023-09-25 NOTE — Progress Notes (Signed)
  Name:Dawn Caldwell       MRN: [id number] Age/Date of Birth: 48 y.o. [birth year]       Date of Contact: 09/25/2023 [facility] [address] [place] [address] Dept: [phone]    Initial telephone call summary:  Navigator call intiated to [name] before  appointment.  Explained navigator role, provided my contact information, offered directions, and discussed patient's understanding of reason for appointment, barriers to attending appointments, and any other concerns related to appointment. Patient verbalized understanding of information discussed during today's call.  Oncology Navigator Initial Assessment Documentation:     Nichole Vivian Lunger, RN

## 2023-09-26 NOTE — Progress Notes (Signed)
 --Thirty minutes were spent during this new patient encounter. There were no encounter diagnoses.  --More than half of that time was spent in a face to face manner in the examination room, coordinating the patient's orthopedic care, and counseling and educating the patient. Here to discusss lipoma of back Diagnosed with ultrasound recently.  L lower back area has had years of pain with relief using local steroid injections in PCP office but recently US  exam of 'lump' in region of SI joint suggests lipoma  --Together we reviewed the pertinent physical findings on today's exam: no discrete soft tissue mass noted SI joint at PSIS area is tender. NVI  --Also we reviewed clinical information from radiographic studies: X-Ray: slight sclerosis of L SI on AP pelvis previously but no advanced imaging  --We discussed the significance of the DDX mass vs more likely SI joint pain/spondylosis  --Based on this information I communicated my impression that our next step is MRI  --Together we agreed to proceed with f/u after scan has been completed  ---------------  Medical History[1]  Surgical History[2]  Medications Ordered Prior to Encounter[3]  Allergies[4]  Family History[5] Otherwise, no relevant orthopaedic family history.  ROS:  Complete review of systems performed, negative except that mentioned in the HPI  Electronically signed by:  Glendia Ambrose Blush, MD 09/26/2023 12:27 PM      [1] Past Medical History: Diagnosis Date  . Allergy   . Arthritis   [2] Past Surgical History: Procedure Laterality Date  . BUNIONECTOMY Right 08/25/2023   BUNIONECTOMY LAPIDUS performed by Mabel Glendia Breeding, DPM at [facility]  . LAPAROSCOPIC CHOLECYSTECTOMY W/ CHOLANGIOGRAPHY N/A 04/24/2017   Procedure: CHOLECYSTECTOMY LAPAROSCOPIC W/ CHOLANGIOGRAM;  Surgeon: Garnette Jayson Ginger, MD;  Location: [facility] OUTPATIENT OR;  Service: General;  Laterality: N/A;  . WISDOM TOOTH EXTRACTION     Procedure:  WISDOM TOOTH EXTRACTION  [3] Current Outpatient Medications on File Prior to Visit  Medication Sig Dispense Refill  . cholecalciferol (VITAMIN D3) 1,000 unit (25 mcg) tablet Take 1,000 Units by mouth daily.    . loratadine  (CLARITIN ) 10 mg tablet Take 10 mg by mouth daily as needed for allergies.    . multivitamin (THERAGRAN) tab tablet Take 1 tablet by mouth daily.    . polyvinyl alcohol (artificial tears) ophthalmic solution Administer 1 drop into both eyes daily as needed for dry eyes.    . [DISCONTINUED] aspirin 81 mg EC tablet Take 1 tablet (81 mg total) by mouth 2 (two) times a day. 168 tablet 0  . [DISCONTINUED] diclofenac (VOLTAREN) 75 mg EC tablet Take 1 tablet (75 mg total) by mouth 2 (two) times a day. (Patient taking differently: Take 75 mg by mouth 2 (two) times a day as needed (inflammation).) 60 tablet 1  . [DISCONTINUED] oxyCODONE (ROXICODONE) 5 mg immediate release tablet Take 1 tablet (5 mg total) by mouth every 6 (six) hours as needed for severe pain (7-10). 30 tablet 0   No current facility-administered medications on file prior to visit.  [4] Allergies Allergen Reactions  . Codeine Rash  . Erythromycin Base Rash  [5] Family History Problem Relation Name Age of Onset  . Diabetes Mother    . Osteoarthritis Mother    . High Cholesterol Mother    . Gallbladder disease Mother    . Gallbladder disease Sister    . Diabetes Father    . Thyroid  disease Father    . Osteoarthritis Father    . Hypertension Father    . High  Cholesterol Father    . Gallbladder disease Brother    . Breast cancer Neg Hx

## 2023-12-11 ENCOUNTER — Encounter: Admitting: Nurse Practitioner
